# Patient Record
Sex: Male | Born: 1960 | Race: White | Hispanic: No | Marital: Married | Smoking: Never smoker
Health system: Southern US, Community
[De-identification: ages and names within clinical notes are randomized; demographics above are authoritative.]

## PROBLEM LIST (undated history)

## (undated) DIAGNOSIS — E119 Type 2 diabetes mellitus without complications: Secondary | ICD-10-CM

## (undated) DIAGNOSIS — K219 Gastro-esophageal reflux disease without esophagitis: Secondary | ICD-10-CM

## (undated) HISTORY — PX: FOOT SURGERY: SHX648

---

## 2017-07-10 DIAGNOSIS — I10 Essential (primary) hypertension: Secondary | ICD-10-CM | POA: Diagnosis not present

## 2017-08-28 DIAGNOSIS — J020 Streptococcal pharyngitis: Secondary | ICD-10-CM | POA: Diagnosis not present

## 2017-09-08 DIAGNOSIS — R300 Dysuria: Secondary | ICD-10-CM | POA: Diagnosis not present

## 2017-10-13 DIAGNOSIS — Z0000 Encounter for general adult medical examination without abnormal findings: Secondary | ICD-10-CM | POA: Diagnosis not present

## 2017-10-18 DIAGNOSIS — Z125 Encounter for screening for malignant neoplasm of prostate: Secondary | ICD-10-CM | POA: Diagnosis not present

## 2017-10-18 DIAGNOSIS — I10 Essential (primary) hypertension: Secondary | ICD-10-CM | POA: Diagnosis not present

## 2017-12-30 DIAGNOSIS — Z6828 Body mass index (BMI) 28.0-28.9, adult: Secondary | ICD-10-CM | POA: Diagnosis not present

## 2017-12-30 DIAGNOSIS — M25511 Pain in right shoulder: Secondary | ICD-10-CM | POA: Diagnosis not present

## 2018-03-09 DIAGNOSIS — M75101 Unspecified rotator cuff tear or rupture of right shoulder, not specified as traumatic: Secondary | ICD-10-CM | POA: Diagnosis not present

## 2018-03-16 DIAGNOSIS — M25511 Pain in right shoulder: Secondary | ICD-10-CM | POA: Diagnosis not present

## 2018-03-16 DIAGNOSIS — M7541 Impingement syndrome of right shoulder: Secondary | ICD-10-CM | POA: Diagnosis not present

## 2018-04-03 DIAGNOSIS — M542 Cervicalgia: Secondary | ICD-10-CM | POA: Diagnosis not present

## 2018-04-03 DIAGNOSIS — M545 Low back pain: Secondary | ICD-10-CM | POA: Diagnosis not present

## 2018-04-03 DIAGNOSIS — M546 Pain in thoracic spine: Secondary | ICD-10-CM | POA: Diagnosis not present

## 2018-04-06 DIAGNOSIS — M545 Low back pain: Secondary | ICD-10-CM | POA: Diagnosis not present

## 2018-04-06 DIAGNOSIS — M542 Cervicalgia: Secondary | ICD-10-CM | POA: Diagnosis not present

## 2018-04-06 DIAGNOSIS — M546 Pain in thoracic spine: Secondary | ICD-10-CM | POA: Diagnosis not present

## 2018-04-09 DIAGNOSIS — M542 Cervicalgia: Secondary | ICD-10-CM | POA: Diagnosis not present

## 2018-04-09 DIAGNOSIS — M545 Low back pain: Secondary | ICD-10-CM | POA: Diagnosis not present

## 2018-04-09 DIAGNOSIS — M546 Pain in thoracic spine: Secondary | ICD-10-CM | POA: Diagnosis not present

## 2018-05-04 DIAGNOSIS — M7541 Impingement syndrome of right shoulder: Secondary | ICD-10-CM | POA: Diagnosis not present

## 2018-05-20 DIAGNOSIS — M542 Cervicalgia: Secondary | ICD-10-CM | POA: Diagnosis not present

## 2018-05-20 DIAGNOSIS — M545 Low back pain: Secondary | ICD-10-CM | POA: Diagnosis not present

## 2018-05-20 DIAGNOSIS — M546 Pain in thoracic spine: Secondary | ICD-10-CM | POA: Diagnosis not present

## 2018-06-09 DIAGNOSIS — M7540 Impingement syndrome of unspecified shoulder: Secondary | ICD-10-CM | POA: Insufficient documentation

## 2018-06-09 DIAGNOSIS — M25511 Pain in right shoulder: Secondary | ICD-10-CM | POA: Diagnosis not present

## 2018-06-09 DIAGNOSIS — M7541 Impingement syndrome of right shoulder: Secondary | ICD-10-CM | POA: Diagnosis not present

## 2018-06-19 DIAGNOSIS — M7541 Impingement syndrome of right shoulder: Secondary | ICD-10-CM | POA: Diagnosis not present

## 2018-06-19 DIAGNOSIS — M25511 Pain in right shoulder: Secondary | ICD-10-CM | POA: Diagnosis not present

## 2018-07-08 DIAGNOSIS — M75101 Unspecified rotator cuff tear or rupture of right shoulder, not specified as traumatic: Secondary | ICD-10-CM | POA: Diagnosis not present

## 2018-07-08 DIAGNOSIS — M7541 Impingement syndrome of right shoulder: Secondary | ICD-10-CM | POA: Diagnosis not present

## 2018-10-21 DIAGNOSIS — Z4789 Encounter for other orthopedic aftercare: Secondary | ICD-10-CM | POA: Insufficient documentation

## 2020-07-25 ENCOUNTER — Ambulatory Visit: Payer: 59 | Admitting: Podiatry

## 2020-07-25 ENCOUNTER — Other Ambulatory Visit: Payer: Self-pay

## 2020-07-25 ENCOUNTER — Ambulatory Visit (INDEPENDENT_AMBULATORY_CARE_PROVIDER_SITE_OTHER): Payer: 59

## 2020-07-25 ENCOUNTER — Encounter: Payer: Self-pay | Admitting: Podiatry

## 2020-07-25 DIAGNOSIS — M778 Other enthesopathies, not elsewhere classified: Secondary | ICD-10-CM

## 2020-07-25 DIAGNOSIS — M2041 Other hammer toe(s) (acquired), right foot: Secondary | ICD-10-CM

## 2020-07-25 DIAGNOSIS — M2042 Other hammer toe(s) (acquired), left foot: Secondary | ICD-10-CM

## 2020-07-25 DIAGNOSIS — M2012 Hallux valgus (acquired), left foot: Secondary | ICD-10-CM

## 2020-07-25 DIAGNOSIS — M2011 Hallux valgus (acquired), right foot: Secondary | ICD-10-CM | POA: Diagnosis not present

## 2020-07-25 MED ORDER — METHYLPREDNISOLONE 4 MG PO TBPK: ORAL_TABLET | ORAL | 0 refills | Status: DC

## 2020-07-25 MED ORDER — MELOXICAM 15 MG PO TABS
15.0000 mg | ORAL_TABLET | Freq: Every day | ORAL | 3 refills | Status: DC
Start: 2020-07-25 — End: 2020-10-06

## 2020-07-25 NOTE — Progress Notes (Signed)
  Subjective:  Patient ID: Tyler Rangel, male    DOB: 27-Nov-1960,  MRN: [id number] HPI Chief Complaint  Patient presents with  . Foot Pain    1st toes and MPJ/2nd toes bilateral (R>L) - aching, red x several months, plantar forefoot right tender after driving or walking all day, episodes of severe pain, redness and swelling, uses bandaids to cushion areas  . New Patient (Initial Visit)    60 y.o. male presents with the above complaint.   ROS: Denies fever chills nausea vomiting muscle aches pains calf pain back pain chest pain shortness of breath.  No past medical history on file.   Current Outpatient Medications:  .  hydrochlorothiazide (MICROZIDE) 12.5 MG capsule, Take 12.5 mg by mouth daily., Disp: , Rfl:  .  loratadine (CLARITIN) 10 MG tablet, Take 10 mg by mouth daily., Disp: , Rfl:  .  meloxicam (MOBIC) 15 MG tablet, Take 1 tablet (15 mg total) by mouth daily., Disp: 30 tablet, Rfl: 3 .  methylPREDNISolone (MEDROL DOSEPAK) 4 MG TBPK tablet, 6 day dose pack - take as directed, Disp: 21 tablet, Rfl: 0 .  esomeprazole (NEXIUM) 40 MG capsule, Take 40 mg by mouth daily., Disp: , Rfl:   Allergies  Allergen Reactions  . Hydrocodone    Review of Systems Objective:  There were no vitals filed for this visit.  General: Well developed, nourished, in no acute distress, alert and oriented x3   Dermatological: Skin is warm, dry and supple bilateral. Nails x 10 are well maintained; remaining integument appears unremarkable at this time. There are no open sores, no preulcerative lesions, no rash or signs of infection present.  Vascular: Dorsalis Pedis artery and Posterior Tibial artery pedal pulses are 2/4 bilateral with immedate capillary fill time. Pedal hair growth present. No varicosities and no lower extremity edema present bilateral.   Neruologic: Grossly intact via light touch bilateral. Vibratory intact via tuning fork bilateral. Protective threshold with Semmes Wienstein  monofilament intact to all pedal sites bilateral. Patellar and Achilles deep tendon reflexes 2+ bilateral. No Babinski or clonus noted bilateral.   Musculoskeletal: No gross boney pedal deformities bilateral. No pain, crepitus, or limitation noted with foot and ankle range of motion bilateral. Muscular strength 5/5 in all groups tested bilateral.  Pain on range of motion of the first metatarsophalangeal joint with hallux abductovalgus deformities bilateral no crepitation is identified.  He does have hammertoe deformities which are semirigid at the level of the PIPJ and DIPJ.  Gait: Unassisted, Nonantalgic.    Radiographs:  Radiographs taken today demonstrate an osseous mature individual with hallux abductovalgus deformities bilateral mild flexible hammertoe deformities bilateral with some osteoarthritic changes at the PIPJ and DIPJ.  Also demonstrates intracapsular ossification and what appears to be some increase in density of the medial capsule consistent with tophi.  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    Plan: Discussed etiology pathology conservative surgical therapies at this point we are going to ask for a CBC CMP arthritic profile.  I am also going to start him on methylprednisolone to be followed by meloxicam.  I would like to follow-up with him once the results come back or in a month to discuss surgical intervention.     Leevi Cullars T. Victoria Vera, [place]

## 2020-07-27 LAB — COMPLETE METABOLIC PANEL WITH GFR
AG Ratio: 1.5 (calc) (ref 1.0–2.5)
ALT: 37 U/L (ref 9–46)
AST: 20 U/L (ref 10–35)
Albumin: 4.3 g/dL (ref 3.6–5.1)
Alkaline phosphatase (APISO): 113 U/L (ref 35–144)
BUN: 16 mg/dL (ref 7–25)
CO2: 29 mmol/L (ref 20–32)
Calcium: 9.4 mg/dL (ref 8.6–10.3)
Chloride: 106 mmol/L (ref 98–110)
Creat: 1.1 mg/dL (ref 0.70–1.33)
GFR, Est African American: 85 mL/min/{1.73_m2} (ref >=60)
GFR, Est Non African American: 73 mL/min/{1.73_m2} (ref >=60)
Globulin: 2.9 g/dL (calc) (ref 1.9–3.7)
Glucose, Bld: 102 mg/dL (ref 65–139)
Potassium: 4 mmol/L (ref 3.5–5.3)
Sodium: 144 mmol/L (ref 135–146)
Total Bilirubin: 0.7 mg/dL (ref 0.2–1.2)
Total Protein: 7.2 g/dL (ref 6.1–8.1)

## 2020-07-27 LAB — URIC ACID: Uric Acid, Serum: 5.7 mg/dL (ref 4.0–8.0)

## 2020-07-27 LAB — CBC WITH DIFFERENTIAL/PLATELET
Absolute Monocytes: 525 cells/uL (ref 200–950)
Basophils Absolute: 60 cells/uL (ref 0–200)
Basophils Relative: 0.8 %
Eosinophils Absolute: 233 cells/uL (ref 15–500)
Eosinophils Relative: 3.1 %
HCT: 45.9 % (ref 38.5–50.0)
Hemoglobin: 16 g/dL (ref 13.2–17.1)
Lymphs Abs: 2468 cells/uL (ref 850–3900)
MCH: 31.3 pg (ref 27.0–33.0)
MCHC: 34.9 g/dL (ref 32.0–36.0)
MCV: 89.6 fL (ref 80.0–100.0)
MPV: 9.7 fL (ref 7.5–12.5)
Monocytes Relative: 7 %
Neutro Abs: 4215 cells/uL (ref 1500–7800)
Neutrophils Relative %: 56.2 %
Platelets: 250 10*3/uL (ref 140–400)
RBC: 5.12 10*6/uL (ref 4.20–5.80)
RDW: 12.8 % (ref 11.0–15.0)
Total Lymphocyte: 32.9 %
WBC: 7.5 10*3/uL (ref 3.8–10.8)

## 2020-07-27 LAB — C-REACTIVE PROTEIN: CRP: 5.5 mg/L (ref <8.0)

## 2020-07-27 LAB — SEDIMENTATION RATE: Sed Rate: 6 mm/h (ref 0–20)

## 2020-07-27 LAB — ANA: Anti Nuclear Antibody (ANA): NEGATIVE

## 2020-07-27 LAB — RHEUMATOID FACTOR: Rheumatoid fact SerPl-aCnc: <14 IU/mL (ref <14)

## 2020-08-02 DIAGNOSIS — M79676 Pain in unspecified toe(s): Secondary | ICD-10-CM

## 2020-08-04 ENCOUNTER — Telehealth: Payer: Self-pay | Admitting: Podiatry

## 2020-08-04 NOTE — Telephone Encounter (Signed)
Tyler Rangel would like to have intermittent FMLA, due to his gout and foot pain. He said there are days that he is just unable to stand. Please advise?

## 2020-08-08 NOTE — Telephone Encounter (Signed)
Ok. Six months

## 2020-08-10 ENCOUNTER — Telehealth: Payer: Self-pay

## 2020-08-10 NOTE — Telephone Encounter (Signed)
-----   Message from Elinor Parkinson, [place] sent at 07/27/2020  6:54 AM EDT ----- Blood work looks great.

## 2020-08-10 NOTE — Telephone Encounter (Signed)
Patient has been notified of results.  

## 2020-08-29 ENCOUNTER — Other Ambulatory Visit: Payer: Self-pay

## 2020-08-29 ENCOUNTER — Encounter: Payer: Self-pay | Admitting: Podiatry

## 2020-08-29 ENCOUNTER — Ambulatory Visit: Payer: 59 | Admitting: Podiatry

## 2020-08-29 DIAGNOSIS — M2041 Other hammer toe(s) (acquired), right foot: Secondary | ICD-10-CM | POA: Diagnosis not present

## 2020-08-29 DIAGNOSIS — M2042 Other hammer toe(s) (acquired), left foot: Secondary | ICD-10-CM

## 2020-08-29 DIAGNOSIS — M2011 Hallux valgus (acquired), right foot: Secondary | ICD-10-CM | POA: Diagnosis not present

## 2020-08-29 DIAGNOSIS — G8929 Other chronic pain: Secondary | ICD-10-CM

## 2020-08-29 DIAGNOSIS — M778 Other enthesopathies, not elsewhere classified: Secondary | ICD-10-CM

## 2020-08-29 DIAGNOSIS — M79671 Pain in right foot: Secondary | ICD-10-CM

## 2020-08-29 DIAGNOSIS — M2012 Hallux valgus (acquired), left foot: Secondary | ICD-10-CM

## 2020-08-30 NOTE — Progress Notes (Signed)
He presents today states that he is still having chronic pain of the right foot.  Nothing has alleviated his symptoms and is starting to affect his ability to perform his daily activities.  Objective: Vital signs are stable he is alert and oriented x3 pulses are palpable.  He has severe pain on palpation and range of motion of the first metatarsophalangeal joint.  All rheumatic tests were negative.  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  Plan: MRI is requested for evaluation of the first and second metatarsal phalangeal joints.  Rheumatologic evaluation was negative requesting special imaging for differential diagnosis and surgical planning.

## 2020-09-15 ENCOUNTER — Other Ambulatory Visit: Payer: Self-pay

## 2020-09-15 ENCOUNTER — Ambulatory Visit
Admission: RE | Admit: 2020-09-15 | Discharge: 2020-09-15 | Disposition: A | Discharge status: Home / Self Care | Payer: 59 | Source: Ambulatory Visit | Attending: Podiatry | Admitting: Podiatry

## 2020-09-15 DIAGNOSIS — M778 Other enthesopathies, not elsewhere classified: Secondary | ICD-10-CM

## 2020-09-15 DIAGNOSIS — G8929 Other chronic pain: Secondary | ICD-10-CM

## 2020-09-28 ENCOUNTER — Encounter: Payer: Self-pay | Admitting: Podiatry

## 2020-09-28 ENCOUNTER — Other Ambulatory Visit: Payer: Self-pay

## 2020-09-28 ENCOUNTER — Ambulatory Visit: Payer: 59 | Admitting: Podiatry

## 2020-09-28 DIAGNOSIS — M778 Other enthesopathies, not elsewhere classified: Secondary | ICD-10-CM

## 2020-09-28 DIAGNOSIS — M2012 Hallux valgus (acquired), left foot: Secondary | ICD-10-CM

## 2020-09-28 DIAGNOSIS — M2011 Hallux valgus (acquired), right foot: Secondary | ICD-10-CM

## 2020-09-28 MED ORDER — TRIAMCINOLONE ACETONIDE 40 MG/ML IJ SUSP
20.0000 mg | Freq: Once | INTRAMUSCULAR | Status: AC
  Administered 2020-09-28: 20 mg

## 2020-09-28 NOTE — Progress Notes (Signed)
He presents today for follow-up of his MRI states that his right foot is still painful around the first metatarsophalangeal joint.  Objective: Vital signs are stable alert oriented x3.  There is no erythema edema cellulitis drainage or odor he does have pain on palpation and end range of motion of the hallux valgus with limitation resulting from hallux limitus.  MRI does demonstrate osteoarthritis joint space narrowing subchondral sclerosis and displacement at the metatarsophalangeal joint of the right hallux.  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  Plan: We discussed the need for surgical intervention today consisting of a Keller arthroplasty with single silicone implant as well as a hammertoe repair and second met osteotomy.  States he cannot be at work that long so we will have to just give shots as needed.  I injected him today with Kenalog and local anesthetic into the first metatarsophalangeal joint and around the joint he tolerated procedure well and will follow-up with me in the near future.

## 2020-10-06 ENCOUNTER — Other Ambulatory Visit: Payer: Self-pay | Admitting: Podiatry

## 2021-01-23 ENCOUNTER — Other Ambulatory Visit: Payer: Self-pay | Admitting: Podiatry

## 2021-01-25 DIAGNOSIS — M79676 Pain in unspecified toe(s): Secondary | ICD-10-CM

## 2021-05-11 ENCOUNTER — Other Ambulatory Visit: Payer: Self-pay | Admitting: Podiatry

## 2021-06-07 ENCOUNTER — Other Ambulatory Visit: Payer: Self-pay | Admitting: Podiatry

## 2021-07-31 DIAGNOSIS — M79676 Pain in unspecified toe(s): Secondary | ICD-10-CM

## 2021-08-30 ENCOUNTER — Ambulatory Visit (INDEPENDENT_AMBULATORY_CARE_PROVIDER_SITE_OTHER): Payer: 59

## 2021-08-30 ENCOUNTER — Encounter: Payer: Self-pay | Admitting: Podiatry

## 2021-08-30 ENCOUNTER — Ambulatory Visit: Payer: 59 | Admitting: Podiatry

## 2021-08-30 DIAGNOSIS — M778 Other enthesopathies, not elsewhere classified: Secondary | ICD-10-CM

## 2021-08-30 DIAGNOSIS — M2012 Hallux valgus (acquired), left foot: Secondary | ICD-10-CM | POA: Diagnosis not present

## 2021-08-30 DIAGNOSIS — M2011 Hallux valgus (acquired), right foot: Secondary | ICD-10-CM

## 2021-09-02 NOTE — Progress Notes (Signed)
He presents today with his son for continued pain of the first metatarsal phalangeal joint of the right foot.  MRI of the head demonstrated at one time that there was significant osteoarthritic changes of the first metatarsophalangeal joint.  Objective: Vital signs are stable he is alert and oriented x3 there is been no change in the first metatarsophalangeal joint other than worsening of the joint itself.  He states it becomes more more painful it seems all the time is better on the weekends when he is not working and he is able to stay off the foot to some degree.  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  Plan: Discussed etiology pathology conservative surgical therapies discussed left fusion of the first metatarsal phalangeal joint as well as a possible implant of the first metatarsophalangeal joint of the sesamoids are considerably arthritic I did explain to him that this could still be problematic for him.  He asked if there were any other options we did discuss the need for orthotics.  I did explain that orthotics very well could help offload the forefoot enough to allow for at least maybe a 50% reduction in his symptomatology.  He would like to try this prior to any surgical intervention.  Arlys John about to consider casting this patient for neutral orthotics that would offload the first metatarsal phalangeal joint of his right foot.

## 2021-09-06 ENCOUNTER — Ambulatory Visit: Payer: 59

## 2021-09-06 DIAGNOSIS — M2011 Hallux valgus (acquired), right foot: Secondary | ICD-10-CM

## 2021-09-06 DIAGNOSIS — M2041 Other hammer toe(s) (acquired), right foot: Secondary | ICD-10-CM

## 2021-09-06 DIAGNOSIS — M778 Other enthesopathies, not elsewhere classified: Secondary | ICD-10-CM

## 2021-09-06 NOTE — Progress Notes (Signed)
SITUATION Reason for Consult: Evaluation for Bilateral Custom Foot Orthoses Patient / Caregiver Report: Patient is ready for foot orthotics  OBJECTIVE DATA: Patient History / Diagnosis:    ICD-10-CM   1. Capsulitis of foot, right  M77.8     2. Valgus deformity of both great toes  M20.11    M20.12     3. Hammer toes of both feet  M20.41    M20.42       Current or Previous Devices:   None and no history  Foot Examination: Skin presentation:   Intact Ulcers & Callousing:   None Toe / Foot Deformities:  Hammertoes Weight Bearing Presentation:  Cavus Sensation:    Intact  Shoe Size:    9.69M  ORTHOTIC RECOMMENDATION Recommended Device: 1x pair of custom functional foot orthotics  GOALS OF ORTHOSES - Reduce Pain - Prevent Foot Deformity - Prevent Progression of Further Foot Deformity - Relieve Pressure - Improve the Overall Biomechanical Function of the Foot and Lower Extremity.  ACTIONS PERFORMED Potential out of pocket cost was communicated to patient. Patient understood and consent to casting. Patient was casted for Foot Orthoses via crush box. Procedure was explained and patient tolerated procedure well. Casts were shipped to central fabrication. All questions were answered and concerns addressed.  PLAN Patient is to be called for fitting when devices are ready.

## 2021-09-11 ENCOUNTER — Ambulatory Visit: Payer: 59 | Admitting: Podiatry

## 2021-10-18 ENCOUNTER — Encounter: Payer: Self-pay | Admitting: Podiatry

## 2021-10-18 ENCOUNTER — Ambulatory Visit (INDEPENDENT_AMBULATORY_CARE_PROVIDER_SITE_OTHER): Payer: 59

## 2021-10-18 DIAGNOSIS — M2041 Other hammer toe(s) (acquired), right foot: Secondary | ICD-10-CM

## 2021-10-18 DIAGNOSIS — M2012 Hallux valgus (acquired), left foot: Secondary | ICD-10-CM

## 2021-10-18 DIAGNOSIS — M2042 Other hammer toe(s) (acquired), left foot: Secondary | ICD-10-CM

## 2021-10-18 DIAGNOSIS — M778 Other enthesopathies, not elsewhere classified: Secondary | ICD-10-CM

## 2021-10-18 DIAGNOSIS — M2011 Hallux valgus (acquired), right foot: Secondary | ICD-10-CM

## 2021-10-19 NOTE — Progress Notes (Signed)
Reason for Visit:        Fitting and Delivery of Custom Fabricated Foot Orthotics Patient Report:            Patient reports comfort and is satisfied with device.   ACTIONS PERFORMED Patient was fit with foot orthotics trimmed to shoe last. Patient tolerated fitting procedure.    Patient was provided with verbal and written instruction and demonstration regarding  wear, care, proper fit, function, purpose, cleaning, and use of the orthosis and in all related precautions and risks and benefits regarding the orthosis.   Patient was also provided with verbal instruction regarding how to report any failures or malfunctions of the orthosis and necessary follow up care. Patient was also instructed to contact our office regarding any change in status that may affect the function of the orthosis.   Patient demonstrated independence with proper donning, doffing, and fit and verbalized understanding of all instructions.   PLAN: Patient is to follow up in one week or as necessary (PRN). All questions were answered and concerns addressed. 

## 2021-11-01 ENCOUNTER — Encounter: Payer: Self-pay | Admitting: Podiatry

## 2021-11-01 ENCOUNTER — Ambulatory Visit: Payer: 59 | Admitting: Podiatry

## 2021-11-01 DIAGNOSIS — G5791 Unspecified mononeuropathy of right lower limb: Secondary | ICD-10-CM

## 2021-11-01 DIAGNOSIS — M7751 Other enthesopathy of right foot: Secondary | ICD-10-CM | POA: Diagnosis not present

## 2021-11-01 DIAGNOSIS — M778 Other enthesopathies, not elsewhere classified: Secondary | ICD-10-CM

## 2021-11-01 MED ORDER — TRIAMCINOLONE ACETONIDE 40 MG/ML IJ SUSP
60.0000 mg | Freq: Once | INTRAMUSCULAR | Status: AC
  Administered 2021-11-01: 60 mg

## 2021-11-01 NOTE — Progress Notes (Signed)
He presents today for follow-up of his capsulitis right foot.  States that he has been wearing his orthotics and doing everything that he can.  He says I think he just overdid about wearing them for a long time at church the other day states that he is got some sharp shooting pains between his toes and radiating out his toes and across his forefoot.  He states that it is hurting pretty bad at this time think I can stand at work and do what I have to do.  Objective: Vital signs are stable he is alert and oriented x3.  Hallux valgus deformity with hallux interphalangeal and osteoarthritic changes at the hallux interphalangeal joint.  Hammertoe deformity with capsulitis at the second metatarsal phalangeal joint and flexible hammertoe deformities #3 #4 #5 of the right foot.  He does have pain on palpation and range of motion of the second metatarsophalangeal joint though he does have soreness in the second interdigital space third interdigital space and the first interdigital space.  Assessment: Hallux valgus deformity capsulitis cocked up hammertoe deformity and neuritis.  Plan: Discussed etiology pathology conservative therapies injected the first interdigital space with 10 mg of Kenalog secondary to10 mg Kenalog third interdigital space with 10 mg of Kenalog.  He tolerated procedure well recommended he continue the use of his orthotics and recommend he be out of work Today and tomorrow states that he is going on vacation next week stating he will be at home resting this foot trying to get it ready to go back to work.

## 2022-05-07 ENCOUNTER — Ambulatory Visit (INDEPENDENT_AMBULATORY_CARE_PROVIDER_SITE_OTHER): Payer: 59

## 2022-05-07 ENCOUNTER — Ambulatory Visit: Payer: 59 | Admitting: Podiatry

## 2022-05-07 ENCOUNTER — Encounter: Payer: Self-pay | Admitting: Podiatry

## 2022-05-07 DIAGNOSIS — M778 Other enthesopathies, not elsewhere classified: Secondary | ICD-10-CM

## 2022-05-07 DIAGNOSIS — S92352A Displaced fracture of fifth metatarsal bone, left foot, initial encounter for closed fracture: Secondary | ICD-10-CM

## 2022-05-07 NOTE — Progress Notes (Signed)
He presents today for follow-up of his pain to his left foot.  States that he did started about a week or so ago and hurts right here as he points to the fifth metatarsal.  Objective: Vital signs stable he is alert and oriented x 3.  Severe hallux valgus deformities hammertoe deformities bilateral.  He does have swelling and mild pitting edema overlying the fifth metatarsal base of the left foot.  Exquisitely tender on palpation dorsal and plantar.  Radiographs taken today demonstrate a single fracture line that does not comminute the fifth met base.  Assessment: Stress fracture fifth met base left foot.  Plan: Placed him in a cam boot and will follow-up with him in 6 to 8 weeks.

## 2022-06-04 ENCOUNTER — Ambulatory Visit: Payer: 59 | Admitting: Podiatry

## 2022-06-04 ENCOUNTER — Ambulatory Visit (INDEPENDENT_AMBULATORY_CARE_PROVIDER_SITE_OTHER): Payer: 59

## 2022-06-04 ENCOUNTER — Encounter: Payer: Self-pay | Admitting: Podiatry

## 2022-06-04 DIAGNOSIS — S92355D Nondisplaced fracture of fifth metatarsal bone, left foot, subsequent encounter for fracture with routine healing: Secondary | ICD-10-CM

## 2022-06-04 NOTE — Progress Notes (Signed)
Sher presents today for follow-up of his fifth metatarsal base fracture left.  States that it seems to be doing okay.  Objective: Vital signs are stable alert oriented x 3.  There is no erythema edema cellulitis drainage or odor.  There is still tenderness on palpation of fifth met base left.  Radiographs taken today demonstrate radiolucency around the plantar portion of the fracture on lateral view.  Assessment: Fifth metatarsal base fracture left.  Nondisplaced none comminuted.  Plan: Put him back in a cam walker which she can only wear at home versus work but he is wearing it on the weekends.  Follow-up with him in about 1 month for this.  Another set of x-rays will be necessary.

## 2022-06-20 ENCOUNTER — Ambulatory Visit (INDEPENDENT_AMBULATORY_CARE_PROVIDER_SITE_OTHER): Payer: 59

## 2022-06-20 ENCOUNTER — Encounter: Payer: Self-pay | Admitting: Podiatry

## 2022-06-20 ENCOUNTER — Ambulatory Visit: Payer: 59 | Admitting: Podiatry

## 2022-06-20 DIAGNOSIS — S92355D Nondisplaced fracture of fifth metatarsal bone, left foot, subsequent encounter for fracture with routine healing: Secondary | ICD-10-CM

## 2022-06-20 NOTE — Progress Notes (Signed)
  He presents today for follow-up of his fracture fifth metatarsal left.  States he is doing quite well is feeling much better.  Objective: Vital signs are stable she is alert oriented x 3.  He has minimal tenderness on palpation of the fifth metatarsal base left foot.  Radiographs taken today demonstrate some consolidation starting to take place with a Jones fracture.  No diastases.  No fragmentation.  Assessment: Well-healing Jones fracture left.  Plan: Encouraged him to continue to wear the cam boot is much as he can he is going to be out of work for the next week or so for vacation and states he is planning to wear the boot at all times.  He will need a note for being out of work today and tomorrow and we will do that for him.  Follow-up with him in 6 weeks

## 2022-06-26 ENCOUNTER — Telehealth: Payer: Self-pay | Admitting: Podiatry

## 2022-06-26 NOTE — Telephone Encounter (Signed)
Good Morning Mr. Tyler Rangel would like to have his intermittent leave for work updated, he had a history of capsulitis but I didn't see it on his diagnosis, this time. Please advise about intermittent leave for his job?

## 2022-06-27 ENCOUNTER — Ambulatory Visit: Payer: 59 | Admitting: Podiatry

## 2022-07-04 ENCOUNTER — Ambulatory Visit: Payer: 59 | Admitting: Podiatry

## 2022-07-04 ENCOUNTER — Encounter: Payer: Self-pay | Admitting: Podiatry

## 2022-07-04 DIAGNOSIS — M7751 Other enthesopathy of right foot: Secondary | ICD-10-CM | POA: Diagnosis not present

## 2022-07-04 MED ORDER — TRIAMCINOLONE ACETONIDE 10 MG/ML IJ SUSP
20.0000 mg | Freq: Once | INTRAMUSCULAR | Status: AC
  Administered 2022-07-04: 20 mg

## 2022-07-04 NOTE — Progress Notes (Signed)
Subjective:   Patient ID: Tyler Rangel, male   DOB: 62 y.o.   MRN: [id number]   HPI Patient states he was doing good with his right foot till recently but has put a lot more pressure on the foot because of his fracture of the left foot with having to wear a walking boot   ROS      Objective:  Physical Exam  Neurovascular status intact with inflammation fluid around the second third and fourth metatarsal phalangeal joints right painful when pressed that did well when treated in July     Assessment:  Inflammatory capsulitis of the lesser MPJs right with history of condition that did well and offloading onto the right foot because of fracture left foot     Plan:  H&P done sterile prep and went ahead today and did periarticular injection around the second and third third and fourth MPJ and then sterile prep and did periarticular injection around the fourth MPJ right with dexamethasone Kenalog Xylocaine combination and applied sterile dressing.  Reappoint see Dr. Milinda Pointer regular appointment in April

## 2022-07-18 ENCOUNTER — Encounter: Payer: Self-pay | Admitting: Podiatry

## 2022-07-18 ENCOUNTER — Ambulatory Visit (INDEPENDENT_AMBULATORY_CARE_PROVIDER_SITE_OTHER): Payer: 59

## 2022-07-18 ENCOUNTER — Other Ambulatory Visit: Payer: Self-pay | Admitting: Podiatry

## 2022-07-18 ENCOUNTER — Ambulatory Visit: Payer: 59 | Admitting: Podiatry

## 2022-07-18 DIAGNOSIS — S92355D Nondisplaced fracture of fifth metatarsal bone, left foot, subsequent encounter for fracture with routine healing: Secondary | ICD-10-CM

## 2022-07-18 DIAGNOSIS — M7751 Other enthesopathy of right foot: Secondary | ICD-10-CM

## 2022-07-18 NOTE — Progress Notes (Signed)
He presents today for follow-up of a fracture fifth metatarsal base of the left foot states that is feeling great I have had no problems with it since have been wearing the boot.  He states that this right foot however is starting to give me a lot of trouble and it is affecting my ability to not only work to perform my daily activities and maintain my general health.  Objective: Vital signs are stable he is alert and oriented x 3.  Pulses are palpable.  Moderate to severe hallux abductovalgus deformity of the right foot with radiographic measurements measuring an IM angle of 12 degrees hallux abductus angle of 25 degrees severe arthritic hammertoe deformity and dislocated second metatarsal phalangeal joint.  Findings are consistent with hallux valgus deformity capsulitis and hammertoe deformity with plantarflexed second metatarsal these findings are consistent on physical exam as well.  No pain on palpation of the left foot.  Assessment: Hallux abductovalgus deformity of the right foot plantarflexed second metatarsal right foot hammertoe second digit right foot.  Well-healing fracture left foot.  Plan: Discussed etiology pathology and surgical therapies at this point I recommended that he get started back on his shoewear on his left foot.  We did discuss the need for an [facility] bunion repair on the right foot with second metatarsal osteotomy and hammertoe repair with pin.  He understands this and is amenable to it.  We did discuss the possible postop complications which may include but not limited to postop pain bleeding swell infection recurrence need for further surgery overcorrection under correction loss of digit loss limb loss of life  Dispensed cam boot and information regarding the surgery center and he was sent to scheduling.  I will follow-up with him in the near future for surgical intervention.

## 2022-07-25 ENCOUNTER — Telehealth: Payer: Self-pay | Admitting: Urology

## 2022-07-25 NOTE — Telephone Encounter (Signed)
DOS - 08/16/22  [place] BUNIONECTOMY RIGHT --- 16109 METATARSAL OSTEOTOMY 2ND RIGHT --- 60454 HAMMERTOE REPAIR 2ND RIGHT --- 09811  [facility] EFFECTIVE DATE - 04/08/22  DEDUCTIBLE - $0.00 OOP - $500.00 W/ $398.00 REMAINING COINSURANCE - 0%   PER [employer] WEBSITE FOR CPT CODES 91478, 28308 AND 28285 HAS BEEN APPROVED, AUTH # [id number], GOOD FROM 08/16/22 - 11/14/22.

## 2022-07-30 ENCOUNTER — Encounter: Payer: Self-pay | Admitting: Podiatry

## 2022-08-02 ENCOUNTER — Ambulatory Visit (INDEPENDENT_AMBULATORY_CARE_PROVIDER_SITE_OTHER): Payer: 59

## 2022-08-02 ENCOUNTER — Encounter: Payer: Self-pay | Admitting: Podiatry

## 2022-08-02 ENCOUNTER — Ambulatory Visit: Payer: 59 | Admitting: Podiatry

## 2022-08-02 ENCOUNTER — Telehealth: Payer: Self-pay | Admitting: Podiatry

## 2022-08-02 DIAGNOSIS — R52 Pain, unspecified: Secondary | ICD-10-CM

## 2022-08-02 DIAGNOSIS — S99192S Other physeal fracture of left metatarsal, sequela: Secondary | ICD-10-CM

## 2022-08-02 DIAGNOSIS — M79672 Pain in left foot: Secondary | ICD-10-CM

## 2022-08-02 NOTE — Progress Notes (Signed)
Subjective:  Patient ID: Tyler Rangel, male    DOB: 04-14-1960,  MRN: [id number]  Chief Complaint  Patient presents with   Foot Injury    left foot fracture - very painful, pain located on the lateral side of foot near the 5th digit, pain is dull and aching     62 y.o. male presents with concern for pain in the left foot.  He has a pain on the outside of his left foot at site of prior fracture.  He was previously treated with a cam boot immobilization for a left fifth metatarsal base fracture for multiple months.  He says he is at least 4 months into the treatment and was doing very well had pretty much progressed out of the boot back to regular shoes and was not having any pain at the area of the fracture.  He recently had another injury and now has severe pain at the same area as before.  He has been walking in a cam boot.  He was planned to have surgery with Dr. Al Corpus coming up in 2 weeks for right foot bunion correction.  No past medical history on file.  Allergies  Allergen Reactions   Hydrocodone     ROS: Negative except as per HPI above  Objective:  General: AAO x3, NAD  Dermatological: With inspection and palpation of the right and left lower extremities there are no open sores, no preulcerative lesions, no rash or signs of infection present. Nails are of normal length thickness and coloration.   Vascular:  Dorsalis Pedis artery and Posterior Tibial artery pedal pulses are 2/4 bilateral.  Capillary fill time < 3 sec to all digits.   Neruologic: Grossly intact via light touch bilateral. Protective threshold intact to all sites bilateral.   Musculoskeletal: Pain with palpation of the lateral aspect of the fifth metatarsal base.  Edema and mild inflammation noted at the area.  Gait: Unassisted, Nonantalgic.   No images are attached to the encounter.  Radiographs:  Date: 08/02/2022 XR the left foot Weightbearing AP/Lateral/Oblique   Findings: Attention directed to the fifth  metatarsal base there is noted to be refracture of prior Jones fracture at the metadiaphysis diaphysis junction of the left fifth metatarsal.  There is mild gapping laterally and plantarly seen on oblique and lateral views.  Overall consistent with a Jones fracture of the left fifth met Assessment:   1. Pain   2. Closed fracture of base of fifth metatarsal bone of left foot at metaphyseal-diaphyseal junction, sequela      Plan:  Patient was evaluated and treated and all questions answered.  # Closed fracture of fifth metatarsal base consistent with Jones fracture, refracture prior injury that had healed -Discussed with patient that he does have a refracture of his old fracture that had mostly healed on prior views from last appointment. -I discussed unfortunately he will likely have to hold off on having any surgery done on the right foot with a new fracture on the left fifth metatarsal base -I would now recommend operative fixation of the left fifth metatarsal fracture to include intramedullary screw fixation of his Jones fracture given he has refractured at the same area as before -Discussed the risk benefits alternatives and possible complications associated with fifth metatarsal fracture open reduction internal fixation.  Discussed the expected postoperative recovery course including need for 4 to 6 weeks nonweightbearing following surgery. -Will begin operative planning. informed consent was obtained at this visit -In the meantime continue use of cam  boot immobilization and remain nonweightbearing with the aid of crutches or knee scooter for the left lower extremity.          Corinna Gab, DPM [facility]

## 2022-08-02 NOTE — Telephone Encounter (Signed)
Pt left message today @ 621am stating he needed to get in asap with Dr Al Corpus for his foot that was cracked.  I returned call and left message for pt to call to schedule an appt only available today would be Dr Annamary Rummage in [place]. I did tell pt Dr Al Corpus is out of the office today.  Pt called back and is seeing Dr Annamary Rummage today at 1245 in [place].

## 2022-08-05 ENCOUNTER — Telehealth: Payer: Self-pay | Admitting: Podiatry

## 2022-08-05 NOTE — Telephone Encounter (Signed)
Mckade called this morning stating he can't put weight on his foot. He stated it's been swollen over the weekend and he can't wear a steel toed shoe that is required for work. Please advise.

## 2022-08-06 ENCOUNTER — Encounter: Payer: Self-pay | Admitting: Podiatry

## 2022-08-07 ENCOUNTER — Telehealth: Payer: Self-pay | Admitting: Urology

## 2022-08-07 NOTE — Telephone Encounter (Signed)
DOS - 08/14/22  ORIF 5TH LEFT MET --- 16109  [employer] EFFECTIVE DATE - 04/08/22   DEDUCTIBLE - $0.00 OOP - $500.00 W/ $381.00 REMAINING COINSURANCE - 0%   PER [employer] WEBSITE FOR CPT CODE 60454 Notification or Prior Authorization is not required for the requested services   Decision ID #: [id number]

## 2022-08-13 ENCOUNTER — Other Ambulatory Visit (INDEPENDENT_AMBULATORY_CARE_PROVIDER_SITE_OTHER): Payer: 59 | Admitting: Podiatry

## 2022-08-13 DIAGNOSIS — Z9889 Other specified postprocedural states: Secondary | ICD-10-CM

## 2022-08-13 DIAGNOSIS — S99192S Other physeal fracture of left metatarsal, sequela: Secondary | ICD-10-CM

## 2022-08-13 MED ORDER — OXYCODONE-ACETAMINOPHEN 5-325 MG PO TABS: 1.0000 | ORAL_TABLET | ORAL | 0 refills | Status: AC | PRN

## 2022-08-13 MED ORDER — IBUPROFEN 800 MG PO TABS: 800.0000 mg | ORAL_TABLET | Freq: Three times a day (TID) | ORAL | 0 refills | Status: DC | PRN

## 2022-08-13 MED ORDER — CEPHALEXIN 500 MG PO CAPS: 500.0000 mg | ORAL_CAPSULE | Freq: Three times a day (TID) | ORAL | 0 refills | Status: AC

## 2022-08-13 NOTE — Progress Notes (Signed)
Postop medications sent 

## 2022-08-14 ENCOUNTER — Encounter: Payer: Self-pay | Admitting: Podiatry

## 2022-08-14 DIAGNOSIS — M79672 Pain in left foot: Secondary | ICD-10-CM

## 2022-08-14 DIAGNOSIS — S99192S Other physeal fracture of left metatarsal, sequela: Secondary | ICD-10-CM

## 2022-08-20 ENCOUNTER — Ambulatory Visit (INDEPENDENT_AMBULATORY_CARE_PROVIDER_SITE_OTHER): Payer: 59 | Admitting: Podiatry

## 2022-08-20 ENCOUNTER — Ambulatory Visit: Payer: 59

## 2022-08-20 ENCOUNTER — Ambulatory Visit (INDEPENDENT_AMBULATORY_CARE_PROVIDER_SITE_OTHER): Payer: 59

## 2022-08-20 DIAGNOSIS — M79672 Pain in left foot: Secondary | ICD-10-CM | POA: Diagnosis not present

## 2022-08-20 DIAGNOSIS — S99192S Other physeal fracture of left metatarsal, sequela: Secondary | ICD-10-CM

## 2022-08-20 DIAGNOSIS — Z9889 Other specified postprocedural states: Secondary | ICD-10-CM

## 2022-08-20 NOTE — Progress Notes (Signed)
  Subjective:  Patient ID: Tyler Rangel, male    DOB: 10/08/1960,  MRN: [id number]  Chief Complaint  Patient presents with   Post-op Follow-up    POV # 1 DOS 08/14/22 --- LEFT FOOT 5TH MET BASE FRACTURE ORI, patient has finished all antibiotics and is taking half of pain meds,     DOS: 08/14/2022 Procedure: Open reduction internal fixation of left fifth metatarsal base fracture  62 y.o. male returns for post-op check.  Patient presents for postop visit 1 week status post left foot open reduction and fixation with plate fixation of left fifth metatarsal base fracture.  He has been nonweightbearing to the left foot in a cam boot.  Has kept dressing clean dry intact as instructed.  Using Percocet and ibuprofen for pain.  Review of Systems: Negative except as noted in the HPI. Denies N/V/F/Ch.   Objective:  There were no vitals filed for this visit. There is no height or weight on file to calculate BMI. Constitutional Well developed. Well nourished.  Vascular Foot warm and well perfused. Capillary refill normal to all digits.  Calf is soft and supple, no posterior calf or knee pain, negative Homans' sign  Neurologic Normal speech. Oriented to person, place, and time. Epicritic sensation to light touch grossly present bilaterally.  Dermatologic Skin healing well without signs of infection. Skin edges well coapted without signs of infection.  Orthopedic: Tenderness to palpation noted about the surgical site.   Multiple view plain film radiographs: 08/20/2022 XR 3 views AP lateral oblique of the left foot Nonweightbearing.  Findings: Assessment:   1. Post-operative state   2. Closed fracture of base of fifth metatarsal bone of left foot at metaphyseal-diaphyseal junction, sequela    Plan:  Patient was evaluated and treated and all questions answered.  S/p foot surgery left fifth metatarsal base fracture ORIF -Progressing as expected post-operatively. -XR: As above no acute postoperative  complication -WB Status: Nonweightbearing in cam boot -Sutures: To remain intact until next visit. -Medications: Percocet and ibuprofen for pain -Foot redressed.  Leave dressing clean dry and intact until next visit do not get foot wet yet -Aspirin 81 mg twice daily for DVT prophylaxis  Return in about 1 week (around 08/27/2022).         Corinna Gab, DPM [facility] / [facility]

## 2022-08-22 ENCOUNTER — Encounter: Payer: 59 | Admitting: Podiatry

## 2022-08-27 ENCOUNTER — Ambulatory Visit (INDEPENDENT_AMBULATORY_CARE_PROVIDER_SITE_OTHER): Payer: 59 | Admitting: Podiatry

## 2022-08-27 DIAGNOSIS — S99192S Other physeal fracture of left metatarsal, sequela: Secondary | ICD-10-CM

## 2022-08-27 DIAGNOSIS — Z9889 Other specified postprocedural states: Secondary | ICD-10-CM

## 2022-08-27 MED ORDER — AMOXICILLIN-POT CLAVULANATE 875-125 MG PO TABS: 1.0000 | ORAL_TABLET | Freq: Two times a day (BID) | ORAL | 0 refills | Status: DC

## 2022-08-27 MED ORDER — OXYCODONE-ACETAMINOPHEN 5-325 MG PO TABS: 1.0000 | ORAL_TABLET | ORAL | 0 refills | Status: AC | PRN

## 2022-08-27 NOTE — Progress Notes (Signed)
  Subjective:  Patient ID: Tyler Rangel, male    DOB: 1960-12-21,  MRN: [id number]  Chief Complaint  Patient presents with   Routine Post Op    POV # 2 DOS 08/14/22 --- LEFT FOOT 5TH MET BASE FRACTURE ORIF    DOS: 08/14/2022 Procedure: Open reduction internal fixation of left fifth metatarsal base fracture  62 y.o. male returns for post-op check.  Patient presents for postop visit 2 week status post left foot open reduction and fixation with plate fixation of left fifth metatarsal base fracture.  He has been nonweightbearing to the left foot in a cam boot.  Has kept dressing clean dry intact as instructed.  Using Percocet and ibuprofen for pain.  Review of Systems: Negative except as noted in the HPI. Denies N/V/F/Ch.   Objective:  There were no vitals filed for this visit. There is no height or weight on file to calculate BMI. Constitutional Well developed. Well nourished.  Vascular Foot warm and well perfused. Capillary refill normal to all digits.  Calf is soft and supple, no posterior calf or knee pain, negative Homans' sign  Neurologic Normal speech. Oriented to person, place, and time. Epicritic sensation to light touch grossly present bilaterally.  Dermatologic Skin healing well without signs of infection. Mild maceration of the incision line proximally. Mild erythema surrounding.   Orthopedic: Tenderness to palpation noted about the surgical site.   Multiple view plain film radiographs: Deferred Assessment:   1. Post-operative state   2. Closed fracture of base of fifth metatarsal bone of left foot at metaphyseal-diaphyseal junction, sequela     Plan:  Patient was evaluated and treated and all questions answered.  S/p foot surgery left fifth metatarsal base fracture ORIF -Progressing as expected post-operatively. -XR: As above no acute postoperative complication -WB Status: Nonweightbearing in cam boot -Sutures: To remain intact until next visit. -Medications: Percocet  and ibuprofen for pain -Foot redressed.  Recommend every other day Betadine wet-to-dry dressings to dry out the tissue at the incision site -E Rx for Augmentin 875-125 mg twice daily for the next 10 days -Aspirin 81 mg twice daily for DVT prophylaxis  Return in about 2 weeks (around 09/10/2022) for Third postop visit left fifth met base ORIF.         Corinna Gab, DPM [facility] / [facility]

## 2022-09-05 ENCOUNTER — Encounter: Payer: 59 | Admitting: Podiatry

## 2022-09-06 ENCOUNTER — Telehealth: Payer: Self-pay | Admitting: Podiatry

## 2022-09-06 MED ORDER — DOXYCYCLINE HYCLATE 100 MG PO CAPS: 100.0000 mg | ORAL_CAPSULE | Freq: Two times a day (BID) | ORAL | 0 refills | Status: AC

## 2022-09-06 NOTE — Telephone Encounter (Signed)
Patient called this morning. He is p/o from 5th met. Fracture ORIF.  States the last 2" of his incision is wet/macerated.  Dr. Annamary Rummage told him to put iodine on it, but pt. Felt it needed regular abx ointment some days.  He's changing dressing daily, taking the Augmentin (has 2 days left), icing, elevating, using crutches.  Denies injury.  Taking ibuprofen and oxycodone to manage pain.  Denies fever/N/V.  States this morning the foot was more painful and swollen.  Advised patient to d/c any other abx ointment like neosporin for now.  Do the Iodine every day and cover with gauze dressing (no bandaids), and wear boot.  Will add doxycycline to his abx regimen until he can be seen by Dr. Annamary Rummage on Tuesday.    Patient expressed understanding and will finish the Augmentin, and will start the doxycycline today.

## 2022-09-11 ENCOUNTER — Other Ambulatory Visit: Payer: Self-pay | Admitting: Podiatry

## 2022-09-16 ENCOUNTER — Ambulatory Visit (INDEPENDENT_AMBULATORY_CARE_PROVIDER_SITE_OTHER): Payer: 59

## 2022-09-16 ENCOUNTER — Ambulatory Visit (INDEPENDENT_AMBULATORY_CARE_PROVIDER_SITE_OTHER): Payer: 59 | Admitting: Podiatry

## 2022-09-16 DIAGNOSIS — S99192S Other physeal fracture of left metatarsal, sequela: Secondary | ICD-10-CM

## 2022-09-16 DIAGNOSIS — Z9889 Other specified postprocedural states: Secondary | ICD-10-CM

## 2022-09-16 MED ORDER — GABAPENTIN 300 MG PO CAPS: 300.0000 mg | ORAL_CAPSULE | Freq: Three times a day (TID) | ORAL | 1 refills | Status: DC

## 2022-09-16 MED ORDER — DOXYCYCLINE HYCLATE 100 MG PO TABS: 100.0000 mg | ORAL_TABLET | Freq: Two times a day (BID) | ORAL | 0 refills | Status: AC

## 2022-09-16 MED ORDER — OXYCODONE-ACETAMINOPHEN 5-325 MG PO TABS: 1.0000 | ORAL_TABLET | ORAL | 0 refills | Status: AC | PRN

## 2022-09-16 NOTE — Progress Notes (Signed)
  Subjective:  Patient ID: Tyler Rangel, male    DOB: Jul 09, 1960,  MRN: [id number]  Chief Complaint  Patient presents with   Routine Post Op    POV #3 DOS 08/14/22 --- LEFT FOOT 5TH MET BASE FRACTURE ORIF    DOS: 08/14/2022 Procedure: Open reduction internal fixation of left fifth metatarsal base fracture  62 y.o. male returns for post-op check.  Patient presents for postop visit 4 week status post left foot open reduction and fixation with plate fixation of left fifth metatarsal base fracture.  He has been nonweightbearing to the left foot in a cam boot.  Has been applying Betadine.  Has seen some drainage from the proximal aspect of the incision.  It is improving from prior though after he was placed on doxycycline by Dr. Carlota Raspberry.  He says that he has a lot of burning pain in the left foot near the incision and where the sutures are.  Review of Systems: Negative except as noted in the HPI. Denies N/V/F/Ch.   Objective:  There were no vitals filed for this visit. There is no height or weight on file to calculate BMI. Constitutional Well developed. Well nourished.  Vascular Foot warm and well perfused. Capillary refill normal to all digits.  Calf is soft and supple, no posterior calf or knee pain, negative Homans' sign  Neurologic Normal speech. Oriented to person, place, and time. Epicritic sensation to light touch grossly present bilaterally.  Dermatologic Skin healing well without signs of infection.  Decreased and no maceration present there is dry eschar present at the proximal aspect of the incision which appears fully closed.  There is mild erythema however decreased prior  Orthopedic: Tenderness to palpation noted about the surgical site.  Patient reports burning and shooting pains to this area.   Multiple view plain film radiographs: Deferred Assessment:   1. Post-operative state   2. Closed fracture of base of fifth metatarsal bone of left foot at metaphyseal-diaphyseal junction,  sequela      Plan:  Patient was evaluated and treated and all questions answered.  S/p foot surgery left fifth metatarsal base fracture ORIF -Progressing as expected post-operatively.  Patient has some burning and shooting pains at the incision site likely due to nerve irritation from the plate or the sutures.  The sutures were removed today hopefully that will help -E Rx for gabapentin 300 mg take 3 times daily for nerve pain discussed the risk and benefits including drowsiness this medication -XR: As above no acute postoperative complication, improved osseous healing seen from prior x-ray -WB Status: Nonweightbearing in cam boot -Sutures:  Removed this visit in total -Medications: Percocet and ibuprofen for pain, refill of Percocet sent -Foot redressed.  Recommend every other day Betadine wet-to-dry dressings to dry out the tissue at the incision site -E Rx for doxycycline 100 mg twice daily for the next 14 days -Aspirin 81 mg twice daily for DVT prophylaxis  Return in about 2 weeks (around 09/30/2022) for Fourth postop visit left foot fifth met ORIF.         Corinna Gab, DPM [facility] / [facility]

## 2022-09-26 ENCOUNTER — Encounter: Payer: 59 | Admitting: Podiatry

## 2022-09-30 ENCOUNTER — Ambulatory Visit (INDEPENDENT_AMBULATORY_CARE_PROVIDER_SITE_OTHER): Payer: 59 | Admitting: Podiatry

## 2022-09-30 ENCOUNTER — Ambulatory Visit (INDEPENDENT_AMBULATORY_CARE_PROVIDER_SITE_OTHER): Payer: 59

## 2022-09-30 DIAGNOSIS — Z9889 Other specified postprocedural states: Secondary | ICD-10-CM

## 2022-09-30 MED ORDER — DOXYCYCLINE HYCLATE 100 MG PO TABS: 100.0000 mg | ORAL_TABLET | Freq: Two times a day (BID) | ORAL | 0 refills | Status: AC

## 2022-09-30 MED ORDER — OXYCODONE-ACETAMINOPHEN 5-325 MG PO TABS: 1.0000 | ORAL_TABLET | ORAL | 0 refills | Status: AC | PRN

## 2022-09-30 NOTE — Progress Notes (Unsigned)
Subjective:  Patient ID: Tyler Rangel, male    DOB: February 26, 1961,  MRN: [id number]  Chief Complaint  Patient presents with   Routine Post Op    POV #4 DOS 08/14/22 --- LEFT FOOT 5TH MET BASE FRACTURE ORIF- patient continues to experience burning pain to the incision site. Patient is taking Gabapentin as ordered.     DOS: 08/14/2022 Procedure: Open reduction internal fixation of left fifth metatarsal base fracture  62 y.o. male returns for post-op check.  Patient presents for postop visit 6 week status post left foot open reduction and fixation with plate fixation of left fifth metatarsal base fracture.  Has been applying Betadine to the wound at the proximal incision.  States it is looking better, It is improving from prior, has been on doxycycline.  Patient reports that he is still having a lot of pain associate with any touch around the incision site.  Feels like a burning pain possibly nerve pain.  Does feel like the gabapentin is helping is taking 300 mg x 2 pills at night and 1 in the morning. Denies nausea vomiting fever chills.  Review of Systems: Negative except as noted in the HPI. Denies N/V/F/Ch.   Objective:  There were no vitals filed for this visit. There is no height or weight on file to calculate BMI. Constitutional Well developed. Well nourished.  Vascular Foot warm and well perfused. Capillary refill normal to all digits.  Calf is soft and supple, no posterior calf or knee pain, negative Homans' sign  Neurologic Normal speech. Oriented to person, place, and time. Epicritic sensation to light touch grossly present bilaterally.  Dermatologic Decreased and no maceration present there is dry eschar with superficial  wound present at the proximal aspect of the incision.  There is mild erythema however decreased prior.   Orthopedic: Tenderness to palpation noted about the surgical site.  Patient reports burning and shooting pains to this area.   Multiple view plain film  radiographs: XR 3 views AP lateral bleak of the left foot weightbearing.  Findings: Attention directed to the fifth metatarsal there is noted to be increased osseous bridging across the fracture line however there is a screw that is present at the fracture line making it difficult to assess the healing.  On oblique view there is also possible erosion at the styloid process versus trauma from surgical hardware and drilling of the area when I attempted to place a single percutaneous screw.  Edema noted about the left lateral midfoot. Assessment:   1. Post-operative state      Plan:  Patient was evaluated and treated and all questions answered.  S/p foot surgery left fifth metatarsal base fracture ORIF -Continues to have significant pain at the fracture site and at the site of hardware placement.  Concern for possible nerve irritation on the sural nerve versus CRPS type issue versus infection versus nonunion pain. -Proceed with order CT scan of the left foot to assess the healing at the fracture site. -I also recommend getting ESR and CRP testing done to check for infection. -Continue doxycycline 100 mg twice daily for another 10 days.  Patient has been on 2 to 3 weeks of this already. -As there is concern for possible infection would like to place infectious disease referral as well for their opinion -Continue gabapentin 300 mg take 3 times daily for nerve pain discussed the risk and benefits including drowsiness this medication -XR: As above question osteo lysis at the styloid process however favor postsurgical versus  infectious process -Wound is improving with local wound care continue this with Betadine dressings to the area once daily -WB Status: Weightbearing as tolerated in cam boot -Medications: Percocet and ibuprofen for pain, refill of Percocet sent  No follow-ups on file.         Corinna Gab, DPM [facility] / [facility]

## 2022-10-01 LAB — SEDIMENTATION RATE: Sed Rate: 2 mm/hr (ref 0–30)

## 2022-10-01 LAB — C-REACTIVE PROTEIN: CRP: 4 mg/L (ref 0–10)

## 2022-10-14 ENCOUNTER — Ambulatory Visit (INDEPENDENT_AMBULATORY_CARE_PROVIDER_SITE_OTHER): Payer: 59

## 2022-10-14 ENCOUNTER — Ambulatory Visit (INDEPENDENT_AMBULATORY_CARE_PROVIDER_SITE_OTHER): Payer: 59 | Admitting: Podiatry

## 2022-10-14 DIAGNOSIS — S99192S Other physeal fracture of left metatarsal, sequela: Secondary | ICD-10-CM

## 2022-10-14 DIAGNOSIS — Z9889 Other specified postprocedural states: Secondary | ICD-10-CM | POA: Diagnosis not present

## 2022-10-14 MED ORDER — GABAPENTIN 300 MG PO CAPS
300.0000 mg | ORAL_CAPSULE | Freq: Three times a day (TID) | ORAL | 3 refills | Status: DC
Start: 2022-10-14 — End: 2023-01-02

## 2022-10-14 NOTE — Progress Notes (Unsigned)
  Subjective:  Patient ID: Tyler Rangel, male    DOB: 01-07-1961,  MRN: [id number]  Chief Complaint  Patient presents with   Routine Post Op    POV #5 DOS 08/14/22 --- LEFT FOOT 5TH MET BASE FRACTURE ORIF    DOS: 08/14/2022 Procedure: Open reduction internal fixation of left fifth metatarsal base fracture  62 y.o. male returns for post-op check.  Patient presents for postop visit 8 week status post left foot open reduction and fixation with plate fixation of left fifth metatarsal base fracture.  Still having significant pain at the fracture site.  Describes the pain as burning shooting stabbing pains especially when he puts weight down and is not able to put his full weight down without the aid of a crutch.  Review of Systems: Negative except as noted in the HPI. Denies N/V/F/Ch.   Objective:  There were no vitals filed for this visit. There is no height or weight on file to calculate BMI. Constitutional Well developed. Well nourished.  Vascular Foot warm and well perfused. Capillary refill normal to all digits.  Calf is soft and supple, no posterior calf or knee pain, negative Homans' sign  Neurologic Normal speech. Oriented to person, place, and time. Epicritic sensation to light touch grossly present bilaterally.  Dermatologic Decreased and no maceration present there is dry eschar with superficial  wound present at the proximal aspect of the incision.  There is mild erythema however decreased prior.   Orthopedic: Tenderness to palpation noted about the surgical site.  Patient reports burning and shooting pains to this area.   Multiple view plain film radiographs: XR 3 views AP lateral bleak of the left foot weightbearing.  Findings: Attention directed to the fifth metatarsal there is noted to be increased osseous bridging across the fracture line however there is a screw that is present at the fracture line making it difficult to assess the healing.  On oblique view there is also  possible erosion at the styloid process versus trauma from surgical hardware and drilling of the area when I attempted to place a single percutaneous screw.  Edema noted about the left lateral midfoot. Assessment:   1. Post-operative state   2. Closed fracture of base of fifth metatarsal bone of left foot at metaphyseal-diaphyseal junction, sequela       Plan:  Patient was evaluated and treated and all questions answered.  S/p foot surgery left fifth metatarsal base fracture ORIF -Continues to have significant pain at the fracture site and at the site of hardware placement.  Concern for possible nerve irritation on the sural nerve versus CRPS type issue versus infection versus nonunion pain. -Patient has CT scan of the left foot scheduled for later this month to assess the healing at the fracture site. -Recent ESR and CRP testing were normal with sed rate of 2 and CRP of 4 -Defer further antibiotics at this time -Patient has a appointment scheduled with infectious disease coming up later this month as well -Continue gabapentin 300 mg take 3 times daily for nerve pain  -XR: As above question osteo lysis at the styloid process however favor postsurgical versus infectious process -Wound is improving with local wound care continue this with Betadine dressings to the area once daily -WB Status: Weightbearing as tolerated in cam boot -Medications: Percocet and ibuprofen for pain, refill of Percocet sent  No follow-ups on file.         Corinna Gab, DPM [facility] / [facility]

## 2022-10-24 ENCOUNTER — Ambulatory Visit
Admission: RE | Admit: 2022-10-24 | Discharge: 2022-10-24 | Disposition: A | Discharge status: Home / Self Care | Payer: 59 | Source: Ambulatory Visit | Attending: Podiatry | Admitting: Podiatry

## 2022-10-24 DIAGNOSIS — Z9889 Other specified postprocedural states: Secondary | ICD-10-CM

## 2022-10-28 ENCOUNTER — Encounter: Payer: Self-pay | Admitting: Podiatry

## 2022-10-28 ENCOUNTER — Ambulatory Visit (INDEPENDENT_AMBULATORY_CARE_PROVIDER_SITE_OTHER): Payer: 59 | Admitting: Podiatry

## 2022-10-28 DIAGNOSIS — S99192S Other physeal fracture of left metatarsal, sequela: Secondary | ICD-10-CM

## 2022-10-28 DIAGNOSIS — S92902K Unspecified fracture of left foot, subsequent encounter for fracture with nonunion: Secondary | ICD-10-CM

## 2022-10-28 DIAGNOSIS — Z9889 Other specified postprocedural states: Secondary | ICD-10-CM

## 2022-10-28 NOTE — Progress Notes (Signed)
Subjective:  Patient ID: Tyler Rangel, male    DOB: 1960-09-20,  MRN: [id number]  Chief Complaint  Patient presents with   Follow-up    POV #6 DOS 08/14/22 --- LEFT FOOT 5TH MET BASE FRACTURE ORIF-  CT scan completed. He is not taking Gabapentin to see how the pain is without the medication. He is to return to work August 1st. Wants to make sure he will not have extreme pain.     DOS: 08/14/2022 Procedure: Open reduction internal fixation of left fifth metatarsal base fracture  62 y.o. male returns for post-op check.  Patient presents for postop visit 10 weeks status post left foot open reduction and fixation with plate fixation of left fifth metatarsal base fracture.  Still having significant pain at the fracture site.  Describes the pain as burning shooting stabbing pains especially when he puts weight down and when he wear shoes.  He has pain right at the area of the prior fracture.  He says the wound that was present is now healing up nicely.  He recently had a CT scan completed the read is not done but he is here to go over the results and discuss further options.  Review of Systems: Negative except as noted in the HPI. Denies N/V/F/Ch.   Objective:  There were no vitals filed for this visit. There is no height or weight on file to calculate BMI. Constitutional Well developed. Well nourished.  Vascular Foot warm and well perfused. Capillary refill normal to all digits.  Calf is soft and supple, no posterior calf or knee pain, negative Homans' sign  Neurologic Normal speech. Oriented to person, place, and time. Epicritic sensation to light touch grossly present bilaterally.  Dermatologic Decreased and no maceration present there is dry eschar with superficial  wound present at the proximal aspect of the incision smaller from prior.  There is no erythema improved from prior.  Minimal to no edema present at this time however he says it does swell up throughout the day.   Orthopedic:  Tenderness to palpation noted about the surgical site.  Patient reports burning and shooting pains to this area.   10/24/2022 CT left foot without contrast nonweightbearing -personal read not from radiology- Attention directed to the fifth metatarsal base there is noted to be comminuted fracture of the fifth metatarsal base with apparent nonunion of the fracture line there is appears to be minimal to no osseous bridging noted across the fracture line.  One of the surgical screws through the plate does appear to be through the fracture line Assessment:   1. 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 Post-operative state   3. Closed fracture of base of fifth metatarsal bone of left foot at metaphyseal-diaphyseal junction, sequela      Plan:  Patient was evaluated and treated and all questions answered.  S/p foot surgery left fifth metatarsal base fracture ORIF -Continues to have significant pain at the fracture site and at the site of hardware placement though slightly improved from prior.   -Discussed with patient that based on the CT scan does appear he has nonunion which would explain most of his symptoms.  Will review the radiology read of the CT scan when available to confirm concern for nonunion -Discussed with the patient continued monitoring and giving him more time nonweightbearing versus proceeding with intervention.  I would prefer to proceed with a revision surgery at this time as I do not believe he will fully  healed without that surgery. -We recommend revision open reduction internal fixation of fifth metatarsal base fracture with possible peroneus brevis tendon transfer possible calcaneal bone graft harvest possible bone marrow aspiration from the distal tibia -We discussed risk benefits alternatives and possible complications associated with surgery.  Discussed the expected postoperative recovery course and need for prolonged nonweightbearing following  surgery. -Motion appreciated for shoulder consent was obtained at this visit we will begin surgical planning. -Continue gabapentin for pain control          Corinna Gab, DPM [facility] / [facility]

## 2022-10-28 NOTE — Progress Notes (Deleted)
Patient Active Problem List   Diagnosis Date Noted  . Encounter for orthopedic follow-up care 10/21/2018  . Impingement syndrome of shoulder region 06/09/2018  . Pain in joint of right shoulder 03/16/2018    Patient's Medications  New Prescriptions   No medications on file  Previous Medications   AMOXICILLIN-CLAVULANATE (AUGMENTIN) 875-125 MG TABLET    Take 1 tablet by mouth 2 (two) times daily.   ESOMEPRAZOLE (NEXIUM) 40 MG CAPSULE    Take 40 mg by mouth daily.   GABAPENTIN (NEURONTIN) 300 MG CAPSULE    Take 1 capsule (300 mg total) by mouth 3 (three) times daily.   GABAPENTIN (NEURONTIN) 300 MG CAPSULE    Take 1 capsule (300 mg total) by mouth 3 (three) times daily.   HYDROCHLOROTHIAZIDE (MICROZIDE) 12.5 MG CAPSULE    Take 12.5 mg by mouth daily.   IBUPROFEN (ADVIL) 800 MG TABLET    Take 1 tablet (800 mg total) by mouth every 8 (eight) hours as needed.   LORATADINE (CLARITIN) 10 MG TABLET    Take 10 mg by mouth daily.   MELOXICAM (MOBIC) 15 MG TABLET    TAKE ONE (1) TABLET BY MOUTH EVERY DAY  Modified Medications   No medications on file  Discontinued Medications   No medications on file    Subjective: ***   Review of Systems: ROS  No past medical history on file.  No past surgical history on file.  Social History   Tobacco Use  . Smoking status: Never  . Smokeless tobacco: Never  Substance Use Topics  . Alcohol use: Never    No family history on file.  Allergies  Allergen Reactions  . Hydrocodone     Health Maintenance  Topic Date Due  . HIV Screening  Never done  . Hepatitis C Screening  Never done  . DTaP/Tdap/Td (1 - Tdap) Never done  . Colonoscopy  Never done  . Zoster Vaccines- Shingrix (1 of 2) Never done  . COVID-19 Vaccine (1 - 2023-24 season) Never done  . INFLUENZA VACCINE  11/07/2022  . HPV VACCINES  Aged Out    Objective:  There were no vitals filed for this visit. There is no height or weight on file to calculate  BMI.  Physical Exam Constitutional:      Appearance: Normal appearance.  HENT:     Head: Normocephalic and atraumatic.      Mouth: Mucous membranes are moist.  Eyes:    Conjunctiva/sclera: Conjunctivae normal.     Pupils: Pupils are equal, round, and reactive to light.   Cardiovascular:     Rate and Rhythm: Normal rate and regular rhythm.     Heart sounds: No murmur heard. No friction rub. No gallop.   Pulmonary:     Effort: Pulmonary effort is normal.     Breath sounds: Normal breath sounds.   Abdominal:     General: Non distended     Palpations: soft.   Musculoskeletal:        General: Normal range of motion.   Skin:    General: Skin is warm and dry.     Comments:  Neurological:     General: grossly non focal     Mental Status: awake, alert and oriented to person, place, and time.   Psychiatric:        Mood and Affect: Mood normal.   Lab Results Lab Results  Component Value Date   WBC 7.5 07/25/2020  HGB 16.0 07/25/2020   HCT 45.9 07/25/2020   MCV 89.6 07/25/2020   PLT 250 07/25/2020    Lab Results  Component Value Date   CREATININE 1.10 07/25/2020   BUN 16 07/25/2020   NA 144 07/25/2020   K 4.0 07/25/2020   CL 106 07/25/2020   CO2 29 07/25/2020    Lab Results  Component Value Date   ALT 37 07/25/2020   AST 20 07/25/2020   BILITOT 0.7 07/25/2020    No results found for: "CHOL", "HDL", "LDLCALC", "LDLDIRECT", "TRIG", "CHOLHDL" No results found for: "LABRPR", "RPRTITER" No results found for: "HIV1RNAQUANT", "HIV1RNAVL", "CD4TABS"   Problem List Items Addressed This Visit   None   I have personally spent at least 60 minutes involved in face-to-face and non-face-to-face activities for this patient on the day of the visit. Professional time spent includes the following activities: Preparing to see the patient (review of tests), Obtaining and/or reviewing separately obtained history (admission/discharge record), Performing a medically appropriate  examination and/or evaluation , Ordering medications/tests/procedures, referring and communicating with other health care professionals, Documenting clinical information in the EMR, Independently interpreting results (not separately reported), Communicating results to the patient/family/caregiver, Counseling and educating the patient/family/caregiver and Care coordination (not separately reported).   Victoriano Lain, MD [facility] 10/28/2022, 11:35 AM

## 2022-10-29 ENCOUNTER — Ambulatory Visit: Payer: 59 | Admitting: Infectious Diseases

## 2022-11-06 ENCOUNTER — Telehealth: Payer: Self-pay | Admitting: Podiatry

## 2022-11-06 NOTE — Telephone Encounter (Signed)
[facility] PATIENT, CALLED PT TO SCHEDULE SURGERY. NO ANSWER, LFT VM

## 2022-11-11 ENCOUNTER — Telehealth: Payer: Self-pay | Admitting: Podiatry

## 2022-11-11 NOTE — Telephone Encounter (Signed)
DOS 11/15/2022  ORIF 5TH MET LT - 28485 BONE GRAFT 5TH LT - 20900  [facility] EFFECTIVE DATE - 02/06/2022  DED - $0 OOP - $500 W. $222 REMAINING COINS - 0%  PER [employer] WEBSITE, FOR CPT CODES 09811 AND 20900 - Notification or Prior Authorization is not required for the requested services Decision ID #: [id number] The number above acknowledges your inquiry and our response.

## 2022-11-12 ENCOUNTER — Encounter: Payer: Self-pay | Admitting: Podiatry

## 2022-11-15 ENCOUNTER — Other Ambulatory Visit: Payer: Self-pay | Admitting: Podiatry

## 2022-11-15 DIAGNOSIS — S92902K Unspecified fracture of left foot, subsequent encounter for fracture with nonunion: Secondary | ICD-10-CM

## 2022-11-15 MED ORDER — OXYCODONE-ACETAMINOPHEN 5-325 MG PO TABS: 1.0000 | ORAL_TABLET | ORAL | 0 refills | Status: AC | PRN

## 2022-11-15 MED ORDER — GABAPENTIN 300 MG PO CAPS: 300.0000 mg | ORAL_CAPSULE | Freq: Three times a day (TID) | ORAL | 3 refills | Status: DC

## 2022-11-15 MED ORDER — DOXYCYCLINE HYCLATE 100 MG PO TABS: 100.0000 mg | ORAL_TABLET | Freq: Two times a day (BID) | ORAL | 0 refills | Status: AC

## 2022-11-15 MED ORDER — IBUPROFEN 600 MG PO TABS: 600.0000 mg | ORAL_TABLET | Freq: Three times a day (TID) | ORAL | 0 refills | Status: DC | PRN

## 2022-11-15 NOTE — Progress Notes (Signed)
Post-op meds sent

## 2022-11-18 ENCOUNTER — Other Ambulatory Visit (INDEPENDENT_AMBULATORY_CARE_PROVIDER_SITE_OTHER): Payer: 59 | Admitting: Podiatry

## 2022-11-18 DIAGNOSIS — S92902K Unspecified fracture of left foot, subsequent encounter for fracture with nonunion: Secondary | ICD-10-CM

## 2022-11-18 NOTE — Progress Notes (Signed)
Bone stimulator order placed for Left foot 5th met fracture nonuion

## 2022-11-26 ENCOUNTER — Other Ambulatory Visit: Payer: Self-pay | Admitting: Podiatry

## 2022-11-26 ENCOUNTER — Ambulatory Visit (INDEPENDENT_AMBULATORY_CARE_PROVIDER_SITE_OTHER): Payer: 59 | Admitting: Podiatry

## 2022-11-26 ENCOUNTER — Ambulatory Visit (INDEPENDENT_AMBULATORY_CARE_PROVIDER_SITE_OTHER): Payer: 59

## 2022-11-26 DIAGNOSIS — S99192S Other physeal fracture of left metatarsal, sequela: Secondary | ICD-10-CM

## 2022-11-26 DIAGNOSIS — S92902K Unspecified fracture of left foot, subsequent encounter for fracture with nonunion: Secondary | ICD-10-CM | POA: Diagnosis not present

## 2022-11-26 DIAGNOSIS — Z9889 Other specified postprocedural states: Secondary | ICD-10-CM

## 2022-11-26 NOTE — Progress Notes (Unsigned)
  Subjective:  Patient ID: Tyler Rangel, male    DOB: 02/28/1961,  MRN: [id number]  Chief Complaint  Patient presents with   Routine Post Op    POV # 1 DOS 11/15/2022 --- LEFT FOOT REMOVAL OF HARDWARE, REVISION ORIF 5TH METATARSAL FRACTURE BONE GRAFT HARVEST, BONE MARROW ASPIRATION Patients states he has been taking his antibiotics    DOS: 11/15/2022 Procedure: Left foot fifth metatarsal ORIF revision with calcaneal bone graft harvest and bone marrow aspiration  62 y.o. male returns for post-op check.  Patient returns for first postoperative check 1 week status post above surgery.  He has been nonweightbearing in a posterior splint since surgery.  Cover dressing clean dry intact as instructed.  Review of Systems: Negative except as noted in the HPI. Denies N/V/F/Ch.   Objective:  There were no vitals filed for this visit. There is no height or weight on file to calculate BMI. Constitutional Well developed. Well nourished.  Vascular Foot warm and well perfused. Capillary refill normal to all digits.  Calf is soft and supple, no posterior calf or knee pain, negative Homans' sign  Neurologic Normal speech. Oriented to person, place, and time. Epicritic sensation to light touch grossly present bilaterally.  Dermatologic Skin healing well without signs of infection. Skin edges well coapted without signs of infection.  Orthopedic: Tenderness to palpation noted about the surgical site.   Multiple view plain film radiographs: 11/26/2022 x-ray 3 views AP lateral oblique of the left foot nonweightbearing.  Findings: Assessment:   1. Post-operative state   2. 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 Closed fracture of base of fifth metatarsal bone of left foot at metaphyseal-diaphyseal junction, sequela    Plan:  Patient was evaluated and treated and all questions answered.  S/p foot surgery left -Progressing as expected post-operatively. -XR: *** -WB Status:  *** in *** -Sutures: ***. -Medications: *** -Foot redressed.  No follow-ups on file.         Corinna Gab, DPM [facility] / [facility]

## 2022-12-03 ENCOUNTER — Encounter: Payer: Self-pay | Admitting: Podiatry

## 2022-12-03 ENCOUNTER — Ambulatory Visit: Payer: 59 | Admitting: Podiatry

## 2022-12-03 DIAGNOSIS — S92902K Unspecified fracture of left foot, subsequent encounter for fracture with nonunion: Secondary | ICD-10-CM

## 2022-12-03 DIAGNOSIS — Z9889 Other specified postprocedural states: Secondary | ICD-10-CM

## 2022-12-03 NOTE — Progress Notes (Signed)
  Subjective:  Patient ID: Tyler Rangel, male    DOB: 01-Mar-1961,  MRN: [id number]  Chief Complaint  Patient presents with   Routine Post Op    POV #2 DOS 11/15/2022 --- LEFT FOOT REMOVAL OF HARDWARE, REVISION ORIF 5TH METATARSAL FRACTURE BONE GRAFT HARVEST, BONE MARROW ASPIRATION    DOS: 11/15/2022 Procedure: Left foot fifth metatarsal ORIF revision with calcaneal bone graft harvest and bone marrow aspiration  62 y.o. male returns for post-op check.  Patient returns for postoperative check 2 week status post above surgery.  He has been nonweightbearing in a CAM boot since last apt. Cover dressing clean dry intact as instructed. Patient states he is feeling better this time , decreasing pain.   Review of Systems: Negative except as noted in the HPI. Denies N/V/F/Ch.   Objective:  There were no vitals filed for this visit. There is no height or weight on file to calculate BMI. Constitutional Well developed. Well nourished.  Vascular Foot warm and well perfused. Capillary refill normal to all digits.  Calf is soft and supple, no posterior calf or knee pain, negative Homans' sign  Neurologic Normal speech. Oriented to person, place, and time. Epicritic sensation to light touch grossly present bilaterally.  Dermatologic Skin healing well without signs of infection. Skin edges well coapted without signs of infection.  Orthopedic: Tenderness to palpation noted about the surgical site.   Multiple view plain film radiographs: 11/26/2022 x-ray 3 views AP lateral oblique of the left foot nonweightbearing.  Findings: lateral 5th metatarsal base plate and screws present. No displacement at the fracture line form immediate post op. Minimal to no gap at fracture line. Hardware intact with breakage or loosening.  Assessment:   1. Post-operative state   2. 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  S/p  foot surgery left - revision of nonunion of 5th metatarsal base, BMA, bone graft harvest. - Incision intact without maceration, no evidence of infection.   -Progressing as expected post-operatively.  Patient continuing to improve -XR: At this visit deferred -WB Status: Non weightbearing in CAM boot. Patient has one from prior surgery - Pathology from nonunion site was negative for osteomyelitis, positive for fragments of fibrous tissue and bone -Unfortunately the lab did not receive the bone from styloid process for culture in fast enough time to process the sample and therefore was discarded -Continuing to work on obtaining ultrasound assisted bone stimulator. In process of getting approval.  -Sutures: Removed in total at this visit -Medications: Finish doxycycline and no further antibiotics for now -Foot redressed with mupirocin and nonadherent gauze as well as gauze and Kerlix -Discussed with patient that he should remain nonweightbearing for the next 4 weeks at least.  I will see him back in 2 weeks. -Discussed with patient that he will be out of work until least February 03, 2023 given his current healing and postoperative recovery course as expected.  This is for the safety of the surgical construct and will facilitate osseous healing at the fracture site.  Return in about 2 weeks (around 12/17/2022) for f/u L foot fx.         Corinna Gab, DPM [facility] / [facility]

## 2022-12-04 ENCOUNTER — Telehealth: Payer: Self-pay | Admitting: Podiatry

## 2022-12-04 NOTE — Telephone Encounter (Signed)
Pt called and stated that he was supposed to be prescribed an antifungal cream for his post op. He said he is in pain and that he can't remember what the cream was called but would like a refill for it

## 2022-12-17 ENCOUNTER — Ambulatory Visit (INDEPENDENT_AMBULATORY_CARE_PROVIDER_SITE_OTHER): Payer: 59 | Admitting: Podiatry

## 2022-12-17 ENCOUNTER — Ambulatory Visit (INDEPENDENT_AMBULATORY_CARE_PROVIDER_SITE_OTHER): Payer: 59

## 2022-12-17 DIAGNOSIS — Z9889 Other specified postprocedural states: Secondary | ICD-10-CM

## 2022-12-17 DIAGNOSIS — S92902K Unspecified fracture of left foot, subsequent encounter for fracture with nonunion: Secondary | ICD-10-CM

## 2022-12-17 NOTE — Progress Notes (Unsigned)
Subjective:  Patient ID: Tyler Rangel, male    DOB: 03/24/1961,  MRN: [id number]  Chief Complaint  Patient presents with   Routine Post Op    POV #3 DOS 11/15/2022 --- LEFT FOOT REMOVAL OF HARDWARE, REVISION ORIF 5TH METATARSAL FRACTURE BONE GRAFT HARVEST, BONE MARROW ASPIRATION. Continues to have severe pain when he is walking for long period of time. He is taking the gabapentin as ordered. He is using the cam boot with ambulation.     DOS: 11/15/2022 Procedure: Left foot fifth metatarsal ORIF revision with calcaneal bone graft harvest and bone marrow aspiration  62 y.o. male returns for post-op check.  Patient returns for postoperative check 4 week status post above surgery.   Patient has been weightbearing in a cam boot against prior recommendations to remain nonweightbearing.  He says he wanted to test out how he is feeling when he walked on the foot in the cam boot.  Patient had significant pain after walking on the foot.  Has noticed some swelling as well.  He says the incision is healing  Review of Systems: Negative except as noted in the HPI. Denies N/V/F/Ch.   Objective:  There were no vitals filed for this visit. There is no height or weight on file to calculate BMI. Constitutional Well developed. Well nourished.  Vascular Foot warm and well perfused. Capillary refill normal to all digits.  Calf is soft and supple, no posterior calf or knee pain, negative Homans' sign  Neurologic Normal speech. Oriented to person, place, and time. Epicritic sensation to light touch grossly present bilaterally.  Dermatologic Skin healing well without signs of infection. Skin edges well coapted without signs of infection.  Small area of incomplete healing of the proximal aspect of the incision however improving from prior.  No erythema or drainage.  Orthopedic: Tenderness to palpation noted about the surgical site.   Multiple view plain film radiographs: 9-10 24  x-ray 3 views AP lateral oblique  of the left foot nonweightbearing.  Findings: lateral 5th metatarsal base plate and screws present.  Increased fracture gap approximately 1-2 mm seen on AP view.  Hardware is intact however.  No significant osseous bridging noted on these views at this time however looks better on AP oblique and lateral then on AP Assessment:   1. Post-operative state   2. 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  S/p foot surgery left - revision of nonunion of 5th metatarsal base, BMA, bone graft harvest. - Incision improving without evidence of infection continue to apply mupirocin ointment or Betadine and cover with Band-Aid -Patient unfortunately ambulated against prior recommendations to remain nonweightbearing.  Discussed that this can lead to residual fracture complication and repeat nonunion if he fails to stay off the foot -XR: As above no significant complication noted however there is a small fracture gap increased from prior seen on AP view though without significant displacement of the fracture line -WB Status: Non weightbearing in CAM boot.  Dispensed a secondary cam boot to the patient this visit as his prior 1 was damaged beyond repair due to excessive use. -Patient has ultrasound bone stimulator approved and has a and has possession.  Recommend he begin using that as per the manufacture recommendation -Medications: Gabapentin for pain control no further antibiotics indicated at this time -Foot redressed with mupirocin and adhesive bandage   Return in about 2 weeks (around 12/31/2022) for  POV L 5th met fx.         Corinna Gab, DPM [facility] / [facility]

## 2023-01-02 ENCOUNTER — Other Ambulatory Visit: Payer: Self-pay | Admitting: Podiatry

## 2023-01-06 ENCOUNTER — Ambulatory Visit (INDEPENDENT_AMBULATORY_CARE_PROVIDER_SITE_OTHER): Payer: 59 | Admitting: Podiatry

## 2023-01-06 DIAGNOSIS — S92902K Unspecified fracture of left foot, subsequent encounter for fracture with nonunion: Secondary | ICD-10-CM

## 2023-01-06 DIAGNOSIS — Z9889 Other specified postprocedural states: Secondary | ICD-10-CM

## 2023-01-06 MED ORDER — GABAPENTIN 300 MG PO CAPS: 300.0000 mg | ORAL_CAPSULE | Freq: Three times a day (TID) | ORAL | 3 refills | Status: DC

## 2023-01-06 NOTE — Progress Notes (Signed)
Subjective:  Patient ID: Tyler Rangel, male    DOB: June 15, 1960,  MRN: [id number]  Chief Complaint  Patient presents with   Routine Post Op    POV #4 DOS 11/15/2022 --- LEFT FOOT REMOVAL OF HARDWARE, REVISION ORIF 5TH METATARSAL FRACTURE BONE GRAFT HARVEST, BONE MARROW ASPIRATION    DOS: 11/15/2022 Procedure: Left foot fifth metatarsal ORIF revision with calcaneal bone graft harvest and bone marrow aspiration  62 y.o. male returns for post-op check.  Patient returns for postoperative check 7 week status post above surgery.    Patient has been weightbearing in cam boot.  Patient reports that his pain is much improved from prior.  He has been taking gabapentin and says that is helping a lot as well.  Overall his pain is only occasional and he will have a sharp shooting pain to the area at times.  He is able to walk in the boot without significant pain he says that walking in regular shoes has still caused a little bit of pain now when he is trying to do that.  Review of Systems: Negative except as noted in the HPI. Denies N/V/F/Ch.   Objective:  There were no vitals filed for this visit. There is no height or weight on file to calculate BMI. Constitutional Well developed. Well nourished.  Vascular Foot warm and well perfused. Capillary refill normal to all digits.  Calf is soft and supple, no posterior calf or knee pain, negative Homans' sign  Neurologic Normal speech. Oriented to person, place, and time. Epicritic sensation to light touch grossly present bilaterally.  Dermatologic Skin healing well without signs of infection. Skin edges well coapted without signs of infection.  Prior area of incomplete healing of the proximal incision is now fully healed with mild hyperkeratotic callus overlying.  This is debriding there is no underlying wound.  Orthopedic: Decreased tenderness to palpation noted about the surgical site.   Multiple view plain film radiographs: Deferred at this visit will  take new x-rays at next appointment Assessment:   1. Post-operative state   2. 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  S/p foot surgery left - revision of nonunion of 5th metatarsal base, BMA, bone graft harvest. - Incision fully healed at this time.  Can use scar care lotion or vitamin E lotion and massage area for desensitization purposes -Patient is improved at this time despite ambulating in the cam boot earlier than intended.  He is overall having much less pain and thinks that the bone stimulator he is using is helping. -XR: Deferred at this visit will take new x-rays at next appointment -WB Status: Weightbearing as tolerated at this point in CAM boot.   -Patient has ultrasound bone stimulator and recommend he continue using this as per manufacture guidelines -Medications: Gabapentin for pain control no antibiotics indicated -No dressing needed for the left foot. -Do recommend compressive style ankle sleeve for the left lower extremity for pain control   Return in about 29 days (around 02/04/2023) for POV #5.         Corinna Gab, DPM [facility] / [facility]

## 2023-01-13 ENCOUNTER — Ambulatory Visit (INDEPENDENT_AMBULATORY_CARE_PROVIDER_SITE_OTHER): Payer: 59

## 2023-01-13 ENCOUNTER — Encounter: Payer: Self-pay | Admitting: Podiatry

## 2023-01-13 ENCOUNTER — Ambulatory Visit (INDEPENDENT_AMBULATORY_CARE_PROVIDER_SITE_OTHER): Payer: 59 | Admitting: Podiatry

## 2023-01-13 ENCOUNTER — Other Ambulatory Visit: Payer: Self-pay | Admitting: Podiatry

## 2023-01-13 ENCOUNTER — Other Ambulatory Visit: Payer: Self-pay

## 2023-01-13 DIAGNOSIS — S92902K Unspecified fracture of left foot, subsequent encounter for fracture with nonunion: Secondary | ICD-10-CM

## 2023-01-13 DIAGNOSIS — L02619 Cutaneous abscess of unspecified foot: Secondary | ICD-10-CM

## 2023-01-13 DIAGNOSIS — L03119 Cellulitis of unspecified part of limb: Secondary | ICD-10-CM

## 2023-01-13 DIAGNOSIS — Z9889 Other specified postprocedural states: Secondary | ICD-10-CM | POA: Diagnosis not present

## 2023-01-13 MED ORDER — GABAPENTIN 400 MG PO CAPS: 400.0000 mg | ORAL_CAPSULE | Freq: Three times a day (TID) | ORAL | 2 refills | Status: DC

## 2023-01-13 MED ORDER — AMOXICILLIN-POT CLAVULANATE 875-125 MG PO TABS: 1.0000 | ORAL_TABLET | Freq: Two times a day (BID) | ORAL | 0 refills | Status: DC

## 2023-01-13 MED ORDER — OXYCODONE-ACETAMINOPHEN 5-325 MG PO TABS: 1.0000 | ORAL_TABLET | ORAL | 0 refills | Status: AC | PRN

## 2023-01-13 NOTE — Progress Notes (Unsigned)
Subjective:  Patient ID: Tyler Rangel, male    DOB: 10/19/1960,  MRN: [id number]  No chief complaint on file.   DOS: 11/15/2022 Procedure: Left foot fifth metatarsal ORIF revision with calcaneal bone graft harvest and bone marrow aspiration  62 y.o. male returns for post-op check.  Patient returns for postoperative check 7 week status post above surgery.    Patient has been weightbearing in cam boot.  Patient reports that his pain is much improved from prior.  He has been taking gabapentin and says that is helping a lot as well.  Overall his pain is only occasional and he will have a sharp shooting pain to the area at times.  He is able to walk in the boot without significant pain he says that walking in regular shoes has still caused a little bit of pain now when he is trying to do that.  Review of Systems: Negative except as noted in the HPI. Denies N/V/F/Ch.   Objective:  There were no vitals filed for this visit. There is no height or weight on file to calculate BMI. Constitutional Well developed. Well nourished.  Vascular Foot warm and well perfused. Capillary refill normal to all digits.  Calf is soft and supple, no posterior calf or knee pain, negative Homans' sign  Neurologic Normal speech. Oriented to person, place, and time. Epicritic sensation to light touch grossly present bilaterally.  Dermatologic Skin healing well without signs of infection. Skin edges well coapted without signs of infection.  Prior area of incomplete healing of the proximal incision is now fully healed with mild hyperkeratotic callus overlying.  This is debriding there is no underlying wound.  Orthopedic: Decreased tenderness to palpation noted about the surgical site.   Multiple view plain film radiographs: Deferred at this visit will take new x-rays at next appointment Assessment:   1. Post-operative state   2. 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  S/p foot surgery left - revision of nonunion of 5th metatarsal base, BMA, bone graft harvest. - Incision fully healed at this time.  Can use scar care lotion or vitamin E lotion and massage area for desensitization purposes -Patient is improved at this time despite ambulating in the cam boot earlier than intended.  He is overall having much less pain and thinks that the bone stimulator he is using is helping. -XR: Deferred at this visit will take new x-rays at next appointment -WB Status: Weightbearing as tolerated at this point in CAM boot.   -Patient has ultrasound bone stimulator and recommend he continue using this as per manufacture guidelines -Medications: Gabapentin for pain control no antibiotics indicated -No dressing needed for the left foot. -Do recommend compressive style ankle sleeve for the left lower extremity for pain control   No follow-ups on file.         Corinna Gab, DPM [facility] / [facility]

## 2023-01-20 ENCOUNTER — Ambulatory Visit (INDEPENDENT_AMBULATORY_CARE_PROVIDER_SITE_OTHER): Payer: 59 | Admitting: Podiatry

## 2023-01-20 ENCOUNTER — Encounter: Payer: Self-pay | Admitting: Podiatry

## 2023-01-20 DIAGNOSIS — S92902K Unspecified fracture of left foot, subsequent encounter for fracture with nonunion: Secondary | ICD-10-CM

## 2023-01-20 DIAGNOSIS — L03119 Cellulitis of unspecified part of limb: Secondary | ICD-10-CM

## 2023-01-20 DIAGNOSIS — Z9889 Other specified postprocedural states: Secondary | ICD-10-CM

## 2023-01-20 DIAGNOSIS — L02619 Cutaneous abscess of unspecified foot: Secondary | ICD-10-CM

## 2023-01-20 NOTE — Progress Notes (Signed)
Subjective:  Patient ID: Tyler Rangel, male    DOB: 11-14-1960,  MRN: [id number]  Chief Complaint  Patient presents with   Post-op Problem    Left 5th met base ORIF surgical site infection. Augmentin improved skin redness, swelling. Very tender over proximal incision. Cx grew MSSA. Seeing ID 10/29    62 y.o. 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  Patient was placed on Augmentin 875-125 mg twice daily for 10 days after last appointment.  He has been taking this medication with some stomach discomfort.  Referral was placed to infectious disease.  Has an appointment with them upcoming on October 29th.  Cultures taken at last appointment with MSSA. Reports wound healed up since being on abx. Patient returning primarily to discuss surgical plan.   No past medical history on file.  Allergies  Allergen Reactions   Hydrocodone     ROS: Negative except as per HPI above  Objective:   Constitutional Well developed. Well nourished.  Vascular Foot warm and well perfused. Capillary refill normal to all digits.  Calf is soft and supple, no posterior calf or knee pain, negative Homans' sign  Neurologic Normal speech. Oriented to person, place, and time. Epicritic sensation to light touch grossly present bilaterally.  Dermatologic At the proximal aspect of the incision site there is severe pain with palpation  but overall improved clinically with less redness and wound now closed  with eschar at side of superficial dehisence/abscess which was drained at last visit.    Orthopedic: Severe pain with palpation at the surgical site   Multiple view plain film radiographs: 01/13/2023 XR 3 views AP lateral oblique weightbearing left foot.  Findings: Attention directed to the fifth metatarsal base there is noted to be increased gapping of the fracture line worse from prior with evidence of complete nonunion of the surgical site  no interval osseous healing worse from prior radiographs.  Questionable osteolysis at the styloid process as well as hardware loosening of the distal screws with peri-hardware lucencies.  Concern for infection as well as nonunion Assessment:   1. Post-operative state   2. 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 Cellulitis and abscess of foot excluding toe      Plan:  Patient was evaluated and treated and all questions answered.  # Status post revision of the fifth metatarsal base nonunion with bone marrow aspirate and calcaneal bone graft harvest. -Now with concern for residual malunion as well as infection in the fifth metatarsal base -Overall he is improved since prior visit in regards to surgical site infection. Still cannot rule out underlying infection of 5th met base. - Discussed at length concerns for both nonunion of fx s/p repeat ORIF and also concern for underlying infeciton - Patient has upcoming apt with infectious disease on 10/29.  -At this time in regards to both nonunion and concern for osteomyelitis of the fifth metatarsal I recommended the patient we proceed with hardware removal irrigation and debridement of the surgical site, multiple bone biopsy fifth metatarsal base for pathology and culture, antibiotic bead application possible wound VAC application.  This would be the first surgery of a two-stage procedure with the second stage including further debridement and resection of infected bone based on culture and pathology data with plan to transfer the peroneus brevis tendon to the cuboid.  Discussed the procedure with him as well as the expected need for secondary surgery at  this time.  Also discussed that patient would potentially be admitted between the first and second surgery to allow for IV antibiotic therapy wait for the cultures and biopsy results to come back before proceeding with a second surgery on the same admission. -Discussed risk  benefits alternatives and possible complications associated with his surgery as well as expected postoperative recovery course.  Patient should expect to be out from work for another 2 to 3 months at a minimum following the next procedure. -Difficult situation overall and offered the patient a referral or second opinion in regards to my surgical plan.  I have discussed this plan with multiple other providers in our group.  Patient deferred second opinion referral and wishes to proceed with surgery at this time.  Informed surgical consent was obtained with no guarantee as to outcome given or implied.  Will begin with surgical planning -For now continue weightbearing as tolerated in cam boot and crutches or knee scooter as needed for pain -Can discontinue use of any oral antibiotics at this time if there is worsening of the surgical site I would put him back on some until we can proceed with surgery         Corinna Gab, DPM [facility] / [facility]

## 2023-01-21 LAB — SPECIMEN STATUS REPORT

## 2023-01-21 LAB — AEROBIC CULTURE

## 2023-01-21 LAB — SEDIMENTATION RATE: Sed Rate: 2 mm/h (ref 0–30)

## 2023-01-21 LAB — VITAMIN D 25 HYDROXY (VIT D DEFICIENCY, FRACTURES): Vit D, 25-Hydroxy: 16.5 ng/mL — ABNORMAL LOW (ref 30.0–100.0)

## 2023-01-21 LAB — C-REACTIVE PROTEIN: CRP: 5 mg/L (ref 0–10)

## 2023-01-22 ENCOUNTER — Ambulatory Visit: Payer: 59 | Admitting: Infectious Diseases

## 2023-01-23 ENCOUNTER — Other Ambulatory Visit (INDEPENDENT_AMBULATORY_CARE_PROVIDER_SITE_OTHER): Payer: 59 | Admitting: Podiatry

## 2023-01-23 DIAGNOSIS — S92902K Unspecified fracture of left foot, subsequent encounter for fracture with nonunion: Secondary | ICD-10-CM

## 2023-01-23 DIAGNOSIS — Z9889 Other specified postprocedural states: Secondary | ICD-10-CM

## 2023-01-23 MED ORDER — VITAMIN D (ERGOCALCIFEROL) 1.25 MG (50000 UNIT) PO CAPS: 50000.0000 [IU] | ORAL_CAPSULE | ORAL | 0 refills | Status: DC

## 2023-01-23 NOTE — Progress Notes (Signed)
Vitamin D supplement sent

## 2023-01-24 ENCOUNTER — Encounter: Payer: Self-pay | Admitting: Physical Medicine and Rehabilitation

## 2023-02-03 NOTE — Progress Notes (Signed)
Patient Active Problem List   Diagnosis Date Noted   Encounter for orthopedic follow-up care 10/21/2018   Impingement syndrome of shoulder region 06/09/2018   Pain in joint of right shoulder 03/16/2018   Current Outpatient Medications on File Prior to Visit  Medication Sig Dispense Refill   esomeprazole (NEXIUM) 40 MG capsule Take 40 mg by mouth daily.     gabapentin (NEURONTIN) 400 MG capsule Take 1 capsule (400 mg total) by mouth 3 (three) times daily. 90 capsule 2   hydrochlorothiazide (MICROZIDE) 12.5 MG capsule Take 12.5 mg by mouth daily.     ibuprofen (ADVIL) 600 MG tablet Take 1 tablet (600 mg total) by mouth every 8 (eight) hours as needed. 30 tablet 0   loratadine (CLARITIN) 10 MG tablet Take 10 mg by mouth daily.     meloxicam (MOBIC) 15 MG tablet TAKE ONE (1) TABLET BY MOUTH EVERY DAY 30 tablet 3   Vitamin D, Ergocalciferol, (DRISDOL) 1.25 MG (50000 UNIT) CAPS capsule Take 1 capsule (50,000 Units total) by mouth every 7 (seven) days for 6 doses. 6 capsule 0   amoxicillin-clavulanate (AUGMENTIN) 875-125 MG tablet Take 1 tablet by mouth 2 (two) times daily. (Patient not taking: Reported on 02/04/2023) 20 tablet 0   gabapentin (NEURONTIN) 300 MG capsule Take 1 capsule (300 mg total) by mouth 3 (three) times daily. 90 capsule 1   No current facility-administered medications on file prior to visit.    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  He had a fall early August, resulting in a fracture of the left lateral  foot. Approximately a week after the incident, the patient began experiencing severe pain running up their leg, prompting him to see Podiatry Dr Lia Foyer who had a Xray done and confirmed #. He was given boot to wear but then fractured it again. He underwent left foot open reduction with plate fixation of left fifth metatarsal  base fracture 5/8. 5/21 - prescribed augmentin for 10 days although no signs of infection. 6/10 - doxycycline 100 mg twice daily for the next 14 days. Continued to have significant pain at the # site. 6/24-  doxycycline 100 mg twice daily for another 10 days .  CT 7/18  IMPRESSION: 1. Nondisplaced, ununited fracture at the base of the fifth metatarsal transfixed with a lateral sideplate and interlocking screws. No significant bridging callus formation. Osteolysis along the plantar aspect of the proximal fifth metatarsal adjacent to the fracture cleft small concerning for osteomyelitis. 2. Small area of erosive changes at the proximal most aspect of the fifth metatarsal which may reflect postsurgical changes versus osteomyelitis.  He then underwent Left foot 5th metatarsal ORIF revision with calcaneal bone graft harvest  and bone marrow aspiration on 8/9 given persistent pain. Pathology from non union site negative for osteomyelitis , positive for fragments of tissue and bone. Cx was discarded by lab as did not receive specimen on time.  He was taking PO doxycycline when seen on 8/20.   9/10 visit- Patient unfortunately ambulated against recommendations for NWB leading to #.  Xray with small # gap increased. He developed a small abscess at the incision site which was drained on 10/7 ( cx MSSA) and was prescribed 10 days of PO augmentin. Xray with evidence of hardware loosening infection and non union at the 5th metatarsal # line  Per last  Podiatry note 10/14, Plan  for removal of infected hardware in total with multiple bone biopsies and cultures taken from the fifth metatarsal, insert antibiotic beads in the first surgery. Secondarily depending on the results of the pathology and cultures consider resection of fifth metatarsal and partial or total depending on the extent of infection with transfer of the peroneus brevis tendon to the cuboid. This per patient scheduled early next week. Last dose of  abtx approx 2 weeks ago  Lives with wife Denies smoking, alcohol and IVDU He reports h/o bad arthritis and h/o steroid injections in the rt foot but not in the left Denies h/o DM or vascular disease    Review of Systems: Denies fevers, chills. Denies nausea, vomiting, diarrhea. Denies GU symptoms rashes   No past medical history on file.  No past surgical history on file.   Social History   Tobacco Use   Smoking status: Never   Smokeless tobacco: Never  Substance Use Topics   Alcohol use: Never    No family history on file.  Allergies  Allergen Reactions   Hydrocodone     Health Maintenance  Topic Date Due   HIV Screening  Never done   Hepatitis C Screening  Never done   DTaP/Tdap/Td (1 - Tdap) Never done   Colonoscopy  Never done   Zoster Vaccines- Shingrix (1 of 2) Never done   INFLUENZA VACCINE  Never done   COVID-19 Vaccine (1 - 2023-24 season) Never done   HPV VACCINES  Aged Out    Objective: BP (!) 152/93   Pulse 75   Temp 98.1 F (36.7 C) (Temporal)   Ht 5\' 6"  (1.676 m)   Wt 185 lb (83.9 kg)   SpO2 96%   BMI 29.86 kg/m    Physical Exam Constitutional:      Appearance: Normal appearance.  HENT:     Head: Normocephalic and atraumatic.      Mouth: Mucous membranes are moist.  Eyes:    Conjunctiva/sclera: Conjunctivae normal.     Pupils: Pupils are equal, round, and bilaterally symmetrical   Cardiovascular:     Rate and Rhythm: Normal rate and regular rhythm.     Heart sounds: s1s2  Pulmonary:     Effort: Pulmonary effort is normal.     Breath sounds: Normal breath sounds.   Abdominal:     General: Non distended     Palpations: soft.   Musculoskeletal:        General: Normal range of motion. Left lateral foot wound has closed with no active signs of infection or cellulitis. He is ambulatory   Skin:    General: Skin is warm and dry.     Comments:  Neurological:     General: grossly non focal     Mental Status: awake, alert and  oriented to person, place, and time.   Psychiatric:        Mood and Affect: Mood normal.   Lab Results Lab Results  Component Value Date   WBC 7.5 07/25/2020   HGB 16.0 07/25/2020   HCT 45.9 07/25/2020   MCV 89.6 07/25/2020   PLT 250 07/25/2020    Lab Results  Component Value Date   CREATININE 1.10 07/25/2020   BUN 16 07/25/2020   NA 144 07/25/2020   K 4.0 07/25/2020   CL 106 07/25/2020   CO2 29 07/25/2020    Lab Results  Component Value Date   ALT 37 07/25/2020   AST 20 07/25/2020  BILITOT 0.7 07/25/2020    No results found for: "CHOL", "HDL", "LDLCALC", "LDLDIRECT", "TRIG", "CHOLHDL" No results found for: "LABRPR", "RPRTITER" No results found for: "HIV1RNAQUANT", "HIV1RNAVL", "CD4TABS"   Assessment/Plan # left foot osteomyelitis complicated with hardware   - agree with plan for surgical intervention w hardware removal for curing infection  - no indication for abtx currently as no acute signs of infection/sepsis/would hold off abtx prior to surgical intervention for targeted tx postoperatively - consult ID team inpatient when admitted for further management of abtx - Fu to be made post OR   I have personally spent 61 minutes involved in face-to-face and non-face-to-face activities for this patient on the day of the visit. Professional time spent includes the following activities: Preparing to see the patient (review of tests), Obtaining and/or reviewing separately obtained history (admission/discharge record), Performing a medically appropriate examination and/or evaluation , Ordering medications/tests/procedures, referring and communicating with other health care professionals, Documenting clinical information in the EMR, Independently interpreting results (not separately reported), Communicating results to the patient/family/caregiver, Counseling and educating the patient/family/caregiver and Care coordination (not separately reported).   Of note, portions of this note  may have been created with voice recognition software. While this note has been edited for accuracy, occasional wrong-word or 'sound-a-like' substitutions may have occurred due to the inherent limitations of voice recognition software.   Victoriano Lain, MD [facility] 02/03/2023, 10:42 AM

## 2023-02-04 ENCOUNTER — Other Ambulatory Visit: Payer: Self-pay

## 2023-02-04 ENCOUNTER — Encounter: Payer: Self-pay | Admitting: Infectious Diseases

## 2023-02-04 ENCOUNTER — Ambulatory Visit: Payer: 59 | Admitting: Podiatry

## 2023-02-04 ENCOUNTER — Ambulatory Visit: Payer: 59 | Admitting: Infectious Diseases

## 2023-02-04 VITALS — BP 152/93 | HR 75 | Temp 98.1°F | Ht 66.0 in | Wt 185.0 lb

## 2023-02-04 DIAGNOSIS — Z029 Encounter for administrative examinations, unspecified: Secondary | ICD-10-CM | POA: Insufficient documentation

## 2023-02-04 DIAGNOSIS — Z79899 Other long term (current) drug therapy: Secondary | ICD-10-CM | POA: Diagnosis not present

## 2023-02-04 DIAGNOSIS — T847XXA Infection and inflammatory reaction due to other internal orthopedic prosthetic devices, implants and grafts, initial encounter: Secondary | ICD-10-CM | POA: Insufficient documentation

## 2023-02-04 DIAGNOSIS — Z5181 Encounter for therapeutic drug level monitoring: Secondary | ICD-10-CM | POA: Insufficient documentation

## 2023-02-07 ENCOUNTER — Telehealth: Payer: Self-pay | Admitting: Podiatry

## 2023-02-07 NOTE — Progress Notes (Unsigned)
Subjective:    Patient ID: Tyler Rangel, male    DOB: 1960/05/03, 62 y.o.   MRN: [id number]  HPI  HPI  Orest Dygert is a 62 y.o. year old male  who  has no past medical history on file.   They are presenting to PM&R clinic as a new patient for pain management evaluation. They were referred by Dr. Annamary Rummage, DPM for treatment of L ankle/foot pain.   Source: L foot, ankle; not also low back from compensatory walking Inciting incident: Had a fracture of his left ankle when he was walking his dogs by tripping on a large rock.   Description of pain: intermittent, sharp, burning, and stabbing Exacerbating factors: standing and activity Remitting factors: pain medication  Medications tried: Topical medications (no effect) : none now because can't over surgical site.  Nsaids (mild effect) : Meloxicam 15 mg daily; he has been on that for a a long time.  He takes goody powder occasionally. Denies any GI upset outside of being on PO antibiotics. Denies renal or heart issues. Last labs per chart 2022; he has an appointment coming up with his PCP and will ask about updating this.   Tylenol  (mild effect) : occassionally  Opiates  (mild effect) : Percocet 5 mg #30 tabs prescribed 10/7; he says he has 1/3 of these left. "I will take that if it starts to hurt me pretty bad".  He says he staill gets sharp pains throught this, morstly burning/stinging.   Never been on tramadol  Gabapentin / Lyrica  (mild effect) : Gabapentin 300 mg TID; he says it wears off too quickly though lasts about 4 hours; if he takes 2 it makes him tired. Hasn't tried lyrica.   TCAs  (never tried) :   SNRIs  (never tried) :   Other  (never tried) :   Other treatments: PT/OT  (never tried) : Never offered for his foot/ankle; has had for his left knee and hip. Is currently talking to Dr. Verna Czech about this because it is becoming an issue with getting his FMLA covered with work.   Accupuncture/chiropractor/massage   (moderate effect) : Went to chiropractor last week because walking with the boot has caused back problems; was very helpful.  TENs unit (never tried) :  Injections ( unable to because of infection ) :   Surgery (unsure of effect) :    R 5th MT surgery x2; next scheduled 11/20 after c/b osteomyelitis. Per the patient, he is cleared to weightbear through the foot with a CAM boot for limited ambulation, and is not using and assistive devices  Other  (unsure of effect) :   Goals for pain control: "I go to the kitchen and come back; the gabapentin works pretty good but the pain will just shoot through it and it gets pretty rough."   Prior UDS results: No results found for: "LABOPIA", "COCAINSCRNUR", "LABBENZ", "AMPHETMU", "THCU", "LABBARB"   Pain Inventory Average Pain 8 Pain Right Now 0 My pain is intermittent, sharp, and stabbing  In the last 24 hours, has pain interfered with the following? General activity 7 Relation with others 7 Enjoyment of life 8 What TIME of day is your pain at its worst? morning , daytime, evening, night, and varies Sleep (in general) Poor  Pain is worse with: walking Pain improves with: rest and medication Relief from Meds: 8  how many minutes can you walk? Maybe 15-20 minutes ability to climb steps?  yes do you drive?  yes  employed # of hrs/week Location manager full-time on [employer]  I need assistance with the following:  household duties Do you have any goals in this area?  yes  trouble walking  Any changes since last visit?  yes x-rays at [facility]   Any changes since last visit?  no    No family history on file. Social History   Socioeconomic History   Marital status: Married    Spouse name: Not on file   Number of children: Not on file   Years of education: Not on file   Highest education level: Not on file  Occupational History   Not on file  Tobacco Use   Smoking status: Never   Smokeless tobacco: Never   Substance and Sexual Activity   Alcohol use: Never   Drug use: Never   Sexual activity: Not on file  Other Topics Concern   Not on file  Social History Narrative   Not on file   Social Determinants of Health   Financial Resource Strain: Not on file  Food Insecurity: Not on file  Transportation Needs: Not on file  Physical Activity: Not on file  Stress: Not on file  Social Connections: Not on file   No past surgical history on file. No past medical history on file. There were no vitals taken for this visit.  Opioid Risk Score:   Fall Risk Score:  `1  Depression screen PHQ 2/9      No data to display          Review of Systems  Musculoskeletal:  Positive for gait problem.       Left foot pain & injury   All other systems reviewed and are negative.      Objective:   Physical Exam   PE: Constitution: Appropriate appearance for age. No apparent distress  Resp: No respiratory distress. No accessory muscle usage. on RA and CTAB Cardio: Well perfused appearance. No peripheral edema. Abdomen: Nondistended. Nontender.   Psych: Appropriate mood and affect. Neuro: AAOx4. No apparent cognitive deficits   Neurologic Exam:   DTRs: Reflexes were 2+ in bilateral achilles, patella, biceps, BR and triceps. Babinsky: flexor responses b/l.   Hoffmans: negative b/l Sensory exam: revealed normal sensation in all dermatomal regions in bilateral upper extremities, bilateral lower extremities, and with reduced sensation to light touch in just over left foot lateral surgical scar Motor exam: strength 5/5 throughout BL UE and Les; L ankle ROM slightly reduced and strength testing not performed due to current restrictions Coordination: Fine motor coordination was normal.   Gait: +antalgic gait with L CAM boot; overall stable but reduced stride length and R lean.        Assessment & Plan:   Franklyn Cafaro is a 62 y.o. year old male  who  has no past medical history on file.   They  are presenting to PM&R clinic as a new patient for treatment of L lateral foot pain s/p ORIF and subsequent infection of 5th metatarsal .   Chronic pain syndrome Encounter for therapeutic drug monitoring Encounter for opiate analgesic use agreement Chronic foot pain, left Chronic bilateral low back pain without sciatica  Today, you signed a pain contract and performed a urine drug screen.  I will refill your Percocet 5 mg 1 tab daily as needed for 1 month, then have you follow-up with me in clinic. We discussed using percocet before prolonged standing activities like church instead of after.  If you need additional medication in the immediate postop period, your surgeon is welcome to direct this and I will not restrict you as a result of our pain contract.  We will follow back up on this in 1 month, but let me know if you have any questions.  Today, we got lab works to look at your kidney and liver function.    Depending on the results, we may increase your gabapentin to 400 mg 3 times daily.  I will message you through MyChart regarding this decision.  We also discussed transitioning to Lyrica, but will defer at this time since you are doing so well with gabapentin.  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  Abnormality of gait and mobility Please follow all instructions by your surgeon for weightbearing restrictions both pre and postop.   We will hold off on further referrals for physical therapy, possible Qutenza patch, and possible peripheral nerve stimulator until after your operation on 11-20.  Other orders -     oxyCODONE-Acetaminophen; Take 1 tablet by mouth daily as needed for severe pain (pain score 7-10) (Try to take 20-30 minutes prior to  exacerbating activities).  Dispense: 20 tablet; Refill: 0  Addendum: Lab work showed normal GFR, significant for elevated ALP which is likely secondary to ongoing ankle issues and severely elevated blood glucose of 269.  No documented history of diabetes per the chart, message to patient and ordered hemoglobin A1c, and and forwarding information to PCP for follow-up.

## 2023-02-10 ENCOUNTER — Encounter: Payer: 59 | Attending: Physical Medicine and Rehabilitation | Admitting: Physical Medicine and Rehabilitation

## 2023-02-10 ENCOUNTER — Other Ambulatory Visit: Payer: Self-pay | Admitting: Physical Medicine and Rehabilitation

## 2023-02-10 ENCOUNTER — Encounter: Payer: Self-pay | Admitting: Physical Medicine and Rehabilitation

## 2023-02-10 ENCOUNTER — Telehealth: Payer: Self-pay | Admitting: Podiatry

## 2023-02-10 VITALS — BP 139/89 | HR 61 | Ht 66.0 in | Wt 186.0 lb

## 2023-02-10 DIAGNOSIS — M79672 Pain in left foot: Secondary | ICD-10-CM

## 2023-02-10 DIAGNOSIS — Z5181 Encounter for therapeutic drug level monitoring: Secondary | ICD-10-CM

## 2023-02-10 DIAGNOSIS — G8929 Other chronic pain: Secondary | ICD-10-CM

## 2023-02-10 DIAGNOSIS — Z029 Encounter for administrative examinations, unspecified: Secondary | ICD-10-CM

## 2023-02-10 DIAGNOSIS — M869 Osteomyelitis, unspecified: Secondary | ICD-10-CM | POA: Diagnosis not present

## 2023-02-10 DIAGNOSIS — G894 Chronic pain syndrome: Secondary | ICD-10-CM

## 2023-02-10 DIAGNOSIS — R29898 Other symptoms and signs involving the musculoskeletal system: Secondary | ICD-10-CM

## 2023-02-10 DIAGNOSIS — R739 Hyperglycemia, unspecified: Secondary | ICD-10-CM

## 2023-02-10 DIAGNOSIS — M545 Low back pain, unspecified: Secondary | ICD-10-CM

## 2023-02-10 DIAGNOSIS — R269 Unspecified abnormalities of gait and mobility: Secondary | ICD-10-CM

## 2023-02-10 DIAGNOSIS — T8459XA Infection and inflammatory reaction due to other internal joint prosthesis, initial encounter: Secondary | ICD-10-CM | POA: Diagnosis not present

## 2023-02-10 MED ORDER — OXYCODONE-ACETAMINOPHEN 5-325 MG PO TABS: 1.0000 | ORAL_TABLET | Freq: Every day | ORAL | 0 refills | Status: DC | PRN

## 2023-02-10 NOTE — Patient Instructions (Addendum)
Today, you signed a pain contract and performed a urine drug screen.  I will refill your Percocet 5 mg 1 tab daily as needed for 1 month, then have you follow-up with me in clinic.  We will hold off on further referrals for physical therapy, possible Qutenza patch, and possible peripheral nerve stimulator until after your operation on 11-20.  Please follow all instructions by your surgeon for weightbearing restrictions both pre and postop.  If you need additional medication in the immediate postop period, your surgeon is welcome to direct this and I will not restrict you as a result of her pain contract.  We will follow back up on this in 1 month, but let me know if you have any questions.  Today, we got lab works to look at your kidney and liver function.  Depending on the results, we may increase your gabapentin to 400 mg 3 times daily.  I will message you through MyChart regarding this decision.  We also discussed transitioning to Lyrica, but will defer at this time since you are doing so well with gabapentin.  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

## 2023-02-10 NOTE — Telephone Encounter (Signed)
Patient called stated he needs the doctor to send out an ok for him to get paid from his job. 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 He was informed that he was not approved for the pay to be extended to 39 weeks total. He states he needs additional information from Dr. Annamary Rummage with detailed information of diagnosis/ treatment plan etc.. Dawn with [employer] [phone]. Patient wrote a letter to Dr. Annamary Rummage explaining all of this. I put it in his box.

## 2023-02-10 NOTE — Progress Notes (Signed)
Sure

## 2023-02-11 LAB — CMP14+EGFR
ALT: 44 [IU]/L (ref 0–44)
AST: 24 [IU]/L (ref 0–40)
Albumin: 4.8 g/dL (ref 3.9–4.9)
Alkaline Phosphatase: 158 [IU]/L — ABNORMAL HIGH (ref 44–121)
BUN/Creatinine Ratio: 14 (ref 10–24)
BUN: 15 mg/dL (ref 8–27)
Bilirubin Total: 1 mg/dL (ref 0.0–1.2)
CO2: 26 mmol/L (ref 20–29)
Calcium: 10 mg/dL (ref 8.6–10.2)
Chloride: 100 mmol/L (ref 96–106)
Creatinine, Ser: 1.09 mg/dL (ref 0.76–1.27)
Globulin, Total: 2.9 g/dL (ref 1.5–4.5)
Glucose: 269 mg/dL — ABNORMAL HIGH (ref 70–99)
Potassium: 4.3 mmol/L (ref 3.5–5.2)
Sodium: 142 mmol/L (ref 134–144)
Total Protein: 7.7 g/dL (ref 6.0–8.5)
eGFR: 77 mL/min/{1.73_m2} (ref >59)

## 2023-02-12 ENCOUNTER — Encounter: Payer: Self-pay | Admitting: Physical Medicine and Rehabilitation

## 2023-02-12 ENCOUNTER — Encounter: Payer: Self-pay | Admitting: Podiatry

## 2023-02-12 MED ORDER — GABAPENTIN 400 MG PO CAPS: 400.0000 mg | ORAL_CAPSULE | Freq: Three times a day (TID) | ORAL | 5 refills | Status: DC

## 2023-02-12 NOTE — Addendum Note (Signed)
Addended by: Elijah Birk on: 02/12/2023 04:46 PM   Modules accepted: Orders

## 2023-02-13 ENCOUNTER — Other Ambulatory Visit: Payer: Self-pay

## 2023-02-13 ENCOUNTER — Emergency Department (HOSPITAL_COMMUNITY): Payer: 59

## 2023-02-13 ENCOUNTER — Encounter (HOSPITAL_COMMUNITY): Payer: Self-pay

## 2023-02-13 ENCOUNTER — Inpatient Hospital Stay (HOSPITAL_COMMUNITY)
Admission: EM | Admit: 2023-02-13 | Discharge: 2023-02-24 | DRG: 464 | Disposition: A | Discharge status: Home Health | Payer: 59 | Attending: Family Medicine | Admitting: Family Medicine

## 2023-02-13 DIAGNOSIS — G8929 Other chronic pain: Secondary | ICD-10-CM | POA: Diagnosis present

## 2023-02-13 DIAGNOSIS — L089 Local infection of the skin and subcutaneous tissue, unspecified: Secondary | ICD-10-CM | POA: Diagnosis present

## 2023-02-13 DIAGNOSIS — Z79899 Other long term (current) drug therapy: Secondary | ICD-10-CM

## 2023-02-13 DIAGNOSIS — M869 Osteomyelitis, unspecified: Secondary | ICD-10-CM | POA: Diagnosis present

## 2023-02-13 DIAGNOSIS — T847XXA Infection and inflammatory reaction due to other internal orthopedic prosthetic devices, implants and grafts, initial encounter: Secondary | ICD-10-CM | POA: Diagnosis not present

## 2023-02-13 DIAGNOSIS — I2510 Atherosclerotic heart disease of native coronary artery without angina pectoris: Secondary | ICD-10-CM | POA: Diagnosis present

## 2023-02-13 DIAGNOSIS — M19071 Primary osteoarthritis, right ankle and foot: Secondary | ICD-10-CM | POA: Diagnosis present

## 2023-02-13 DIAGNOSIS — Z885 Allergy status to narcotic agent status: Secondary | ICD-10-CM

## 2023-02-13 DIAGNOSIS — T847XXD Infection and inflammatory reaction due to other internal orthopedic prosthetic devices, implants and grafts, subsequent encounter: Secondary | ICD-10-CM | POA: Diagnosis not present

## 2023-02-13 DIAGNOSIS — E1169 Type 2 diabetes mellitus with other specified complication: Secondary | ICD-10-CM | POA: Diagnosis present

## 2023-02-13 DIAGNOSIS — H538 Other visual disturbances: Secondary | ICD-10-CM | POA: Diagnosis present

## 2023-02-13 DIAGNOSIS — I10 Essential (primary) hypertension: Secondary | ICD-10-CM | POA: Diagnosis present

## 2023-02-13 DIAGNOSIS — Z833 Family history of diabetes mellitus: Secondary | ICD-10-CM

## 2023-02-13 DIAGNOSIS — K219 Gastro-esophageal reflux disease without esophagitis: Secondary | ICD-10-CM | POA: Diagnosis present

## 2023-02-13 DIAGNOSIS — E1165 Type 2 diabetes mellitus with hyperglycemia: Secondary | ICD-10-CM | POA: Diagnosis present

## 2023-02-13 DIAGNOSIS — R3911 Hesitancy of micturition: Secondary | ICD-10-CM | POA: Diagnosis present

## 2023-02-13 DIAGNOSIS — Y792 Prosthetic and other implants, materials and accessory orthopedic devices associated with adverse incidents: Secondary | ICD-10-CM | POA: Diagnosis present

## 2023-02-13 DIAGNOSIS — M86172 Other acute osteomyelitis, left ankle and foot: Secondary | ICD-10-CM | POA: Diagnosis present

## 2023-02-13 DIAGNOSIS — M86672 Other chronic osteomyelitis, left ankle and foot: Secondary | ICD-10-CM | POA: Diagnosis present

## 2023-02-13 DIAGNOSIS — E781 Pure hyperglyceridemia: Secondary | ICD-10-CM | POA: Diagnosis present

## 2023-02-13 DIAGNOSIS — E559 Vitamin D deficiency, unspecified: Secondary | ICD-10-CM | POA: Diagnosis present

## 2023-02-13 DIAGNOSIS — K5900 Constipation, unspecified: Secondary | ICD-10-CM | POA: Diagnosis present

## 2023-02-13 DIAGNOSIS — R001 Bradycardia, unspecified: Secondary | ICD-10-CM | POA: Diagnosis present

## 2023-02-13 DIAGNOSIS — E119 Type 2 diabetes mellitus without complications: Principal | ICD-10-CM

## 2023-02-13 DIAGNOSIS — E11628 Type 2 diabetes mellitus with other skin complications: Secondary | ICD-10-CM | POA: Diagnosis not present

## 2023-02-13 DIAGNOSIS — Z791 Long term (current) use of non-steroidal anti-inflammatories (NSAID): Secondary | ICD-10-CM | POA: Diagnosis not present

## 2023-02-13 DIAGNOSIS — Z8619 Personal history of other infectious and parasitic diseases: Secondary | ICD-10-CM | POA: Diagnosis not present

## 2023-02-13 DIAGNOSIS — T8459XA Infection and inflammatory reaction due to other internal joint prosthesis, initial encounter: Principal | ICD-10-CM | POA: Diagnosis present

## 2023-02-13 DIAGNOSIS — M9689 Other intraoperative and postprocedural complications and disorders of the musculoskeletal system: Secondary | ICD-10-CM | POA: Diagnosis not present

## 2023-02-13 DIAGNOSIS — M5440 Lumbago with sciatica, unspecified side: Secondary | ICD-10-CM | POA: Diagnosis present

## 2023-02-13 DIAGNOSIS — T847XXS Infection and inflammatory reaction due to other internal orthopedic prosthetic devices, implants and grafts, sequela: Secondary | ICD-10-CM | POA: Diagnosis not present

## 2023-02-13 DIAGNOSIS — M86472 Chronic osteomyelitis with draining sinus, left ankle and foot: Secondary | ICD-10-CM | POA: Diagnosis not present

## 2023-02-13 DIAGNOSIS — Z794 Long term (current) use of insulin: Secondary | ICD-10-CM | POA: Diagnosis not present

## 2023-02-13 HISTORY — DX: Gastro-esophageal reflux disease without esophagitis: K21.9

## 2023-02-13 HISTORY — DX: Type 2 diabetes mellitus without complications: E11.9

## 2023-02-13 LAB — CBC WITH DIFFERENTIAL/PLATELET
Abs Immature Granulocytes: 0.02 10*3/uL (ref 0.00–0.07)
Basophils Absolute: 0.1 10*3/uL (ref 0.0–0.1)
Basophils Relative: 1 %
Eosinophils Absolute: 0.3 10*3/uL (ref 0.0–0.5)
Eosinophils Relative: 4 %
HCT: 43.3 % (ref 39.0–52.0)
Hemoglobin: 15.7 g/dL (ref 13.0–17.0)
Immature Granulocytes: 0 %
Lymphocytes Relative: 34 %
Lymphs Abs: 2.4 10*3/uL (ref 0.7–4.0)
MCH: 31.3 pg (ref 26.0–34.0)
MCHC: 36.3 g/dL — ABNORMAL HIGH (ref 30.0–36.0)
MCV: 86.4 fL (ref 80.0–100.0)
Monocytes Absolute: 0.5 10*3/uL (ref 0.1–1.0)
Monocytes Relative: 6 %
Neutro Abs: 3.9 10*3/uL (ref 1.7–7.7)
Neutrophils Relative %: 55 %
Platelets: 246 10*3/uL (ref 150–400)
RBC: 5.01 MIL/uL (ref 4.22–5.81)
RDW: 12.8 % (ref 11.5–15.5)
WBC: 7.2 10*3/uL (ref 4.0–10.5)
nRBC: 0 % (ref 0.0–0.2)

## 2023-02-13 LAB — COMPREHENSIVE METABOLIC PANEL
ALT: 38 U/L (ref 0–44)
AST: 24 U/L (ref 15–41)
Albumin: 4 g/dL (ref 3.5–5.0)
Alkaline Phosphatase: 116 U/L (ref 38–126)
Anion gap: 10 (ref 5–15)
BUN: 17 mg/dL (ref 8–23)
CO2: 25 mmol/L (ref 22–32)
Calcium: 9.6 mg/dL (ref 8.9–10.3)
Chloride: 100 mmol/L (ref 98–111)
Creatinine, Ser: 1.05 mg/dL (ref 0.61–1.24)
GFR, Estimated: >60 mL/min (ref >60)
Glucose, Bld: 359 mg/dL — ABNORMAL HIGH (ref 70–99)
Potassium: 3.8 mmol/L (ref 3.5–5.1)
Sodium: 135 mmol/L (ref 135–145)
Total Bilirubin: 1.3 mg/dL — ABNORMAL HIGH (ref <1.2)
Total Protein: 7.4 g/dL (ref 6.5–8.1)

## 2023-02-13 LAB — GLUCOSE, CAPILLARY: Glucose-Capillary: 238 mg/dL — ABNORMAL HIGH (ref 70–99)

## 2023-02-13 LAB — I-STAT CG4 LACTIC ACID, ED: Lactic Acid, Venous: 1.5 mmol/L (ref 0.5–1.9)

## 2023-02-13 MED ORDER — SODIUM CHLORIDE 0.9 % IV BOLUS
1000.0000 mL | Freq: Once | INTRAVENOUS | Status: AC
Start: 2023-02-13 — End: 2023-02-13
  Administered 2023-02-13: 1000 mL via INTRAVENOUS

## 2023-02-13 MED ORDER — ACETAMINOPHEN 325 MG PO TABS
650.0000 mg | ORAL_TABLET | Freq: Four times a day (QID) | ORAL | Status: DC | PRN
  Administered 2023-02-14 – 2023-02-15 (×3): 650 mg via ORAL
  Filled 2023-02-13 (×2): qty 2

## 2023-02-13 MED ORDER — INSULIN ASPART 100 UNIT/ML IJ SOLN
0.0000 [IU] | Freq: Three times a day (TID) | INTRAMUSCULAR | Status: DC
  Administered 2023-02-14: 2 [IU] via SUBCUTANEOUS
  Administered 2023-02-14: 3 [IU] via SUBCUTANEOUS
  Administered 2023-02-14 – 2023-02-16 (×3): 1 [IU] via SUBCUTANEOUS
  Administered 2023-02-16: 2 [IU] via SUBCUTANEOUS

## 2023-02-13 MED ORDER — HYDROCHLOROTHIAZIDE 12.5 MG PO TABS
12.5000 mg | ORAL_TABLET | Freq: Every day | ORAL | Status: DC
  Administered 2023-02-14 – 2023-02-24 (×11): 12.5 mg via ORAL
  Filled 2023-02-13 (×12): qty 1

## 2023-02-13 MED ORDER — GABAPENTIN 400 MG PO CAPS
400.0000 mg | ORAL_CAPSULE | Freq: Three times a day (TID) | ORAL | Status: DC
  Administered 2023-02-13 – 2023-02-24 (×31): 400 mg via ORAL
  Filled 2023-02-13 (×31): qty 1

## 2023-02-13 MED ORDER — ENOXAPARIN SODIUM 40 MG/0.4ML IJ SOSY
40.0000 mg | PREFILLED_SYRINGE | INTRAMUSCULAR | Status: DC
  Administered 2023-02-13: 40 mg via SUBCUTANEOUS
  Filled 2023-02-13: qty 0.4

## 2023-02-13 MED ORDER — PANTOPRAZOLE SODIUM 40 MG PO TBEC
80.0000 mg | DELAYED_RELEASE_TABLET | Freq: Every day | ORAL | Status: DC
  Administered 2023-02-14 – 2023-02-24 (×10): 80 mg via ORAL
  Filled 2023-02-13 (×9): qty 2
  Filled 2023-02-13: qty 4

## 2023-02-13 NOTE — Hospital Course (Addendum)
Tyler Rangel is a 62 y.o.male with a history of GERD, HTN who was admitted to the [facility] Medicine Teaching Service at [facility] for chronic osteomyelitis. His hospital course is detailed below:  Chronic osteomyelitis to L foot Patient presented with ongoing left foot pain in the setting of chronic osteomyelitis with prior injuries, procedures, and antibiotics. Did not meet sepsis/SIRS criteria at admission. Hardware removal and bone biopsies were performed on 11/10.  Repeat debridement, resection of base of fifth metatarsal on 11/12.  Repeat irrigation and debridement of left foot with tendon transfer on 11/15.  Staph caprae, Staph epidermidis, and GNR found on surgical culture. Per ID, patient was started on Cefazolin, transitioned to daptomycin and cefepime, discharged on daptomycin alone via PICC line. Patient pain well controlled throughout admission. Remains non-weight bearing with boot in place at discharge.   New onset type 2 diabetes Random glucose greater than 300 and A1c of 9.1. previously undiagnosed DM may have contributed repeated infections and chronic osteomyelitis. Patient received SSI during admission with largely stable BG in mid 100s. Patient was discharged on regimen of metformin 500 BID.   Hypertriglyceridemia Lipid panel with triglycerides 375, total 152, HDL 27, LDL 50. Calculated ASCVD risk of 27%.  Patient was started on Lipitor 40mg  during admission.   Urinary hesitancy  Reported months of difficulty with urination, noting difficulty emptying with intermittent segments of urination. No dysuria, hematuria. Postvoid residual volume 14 mL.  PSA WNL.  UA with 30 protein. Started on Flomax 0.4mg  daily. Symptoms improved by time of discharge.   Other chronic conditions were medically managed with home medications and formulary alternatives as necessary (HTN, GERD)  PCP Follow-up Recommendations: CBC, BMP at follow-up Diabetes education for new diagnosis Fu asymptomatic bradycardia  throughout admission  Fu podiatry and ID recs Patient concerned about [facility] of hemochromatosis - consider labs

## 2023-02-13 NOTE — Plan of Care (Signed)
FMTS Brief Progress Note  S: Went to see patient at bedside with Dr. Threasa Beards.  Patient overall doing well except sometimes has nerve pain that he describes in his left foot.  Otherwise doing okay.  O: BP (!) 161/89 (BP Location: Right Arm)   Pulse (!) 49   Temp 97.9 F (36.6 C) (Oral)   Resp 15   Ht 5\' 6"  (1.676 m)   Wt 82.1 kg   SpO2 100%   BMI 29.21 kg/m    General: NAD, awake, alert, responsive to questions Head: Normocephalic atraumatic Respiratory: chest rises symmetrically,  no increased work of breathing on RA Extremities: 2+ DP in left foot, mild tenderness to palpation along incision site, midl pain with movement of foot  A/P: Left foot infection Plan for bone biopsy Saturday morning-podiatry to see patient tomorrow. No antibiotics pending this procedure. - Orders reviewed. Labs for AM ordered, which was adjusted as needed.   Levin Erp, MD 02/13/2023, 10:06 PM PGY-3, [facility] Night Resident  Please page [phone] with questions.

## 2023-02-13 NOTE — Assessment & Plan Note (Addendum)
Presumed new onset diabetes, hyperglycemia greater than 300 with polyuria and polydipsia.  No evidence of DKA. -S/p 1 L fluid bolus -A1C -lipid panel -SSI

## 2023-02-13 NOTE — H&P (Addendum)
Hospital Admission History and Physical Service Pager: [phone]  Patient name: Tyler Rangel Medical record number: [id number] Date of Birth: 05/14/1960 Age: 62 y.o. Gender: male  Primary Care Provider: Jerrye Bushy, FNP Consultants: Podiatry Code Status: Full  Preferred Emergency Contact: Trudell,Kayly (Spouse) [phone] (Mobile)   Chief Complaint: Left foot pain  Assessment and Plan: Dermot Gremillion is a 62 y.o. male presenting with left foot pain due to chronic osteomyelitis s/p left foot fracture requiring surgery and hardware several months ago. XR shows no acute osseous involvement. Podiatry has scheduled pt for surgery on Saturday and will obtain culture before re-starting antibiotics. Pt has no leukocytosis, fever, or elevated lactate concerning for sepsis or systemic infection.   Per infectious disease note from 10/29: Pt had fall early August > left lateral foot fracture. Underwent ORIF with plate fixation 5/8. Given persistent pain, pt underwent revision with calcaneal bone graft harvest and bone marrow aspiration on 8/9. 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 Last podiatry note on 10/14 says plan is for removal of infected hardware with multiple bone biopsies and cultures from fifth metatarsal.  Assessment & Plan Left foot infection Podiatry scheduled pt for surgery on Saturday and will obtain culture and remove hardware. No MRI indicated at this time. XR shows no acute osseous involvement. Pt has no leukocytosis, fever, or elevated lactate concerning for sepsis or systemic infection.  -Admit to FMTS, attending Dr. Linwood Dibbles -hold IV antibiotics until culture obtained -NPO before midnight tomorrow -hold DVT prophylaxis prior to OR -Holding home Mobic prior to surgery -PT after surgery Diabetes mellitus, new onset (HCC) Presumed new onset  diabetes, hyperglycemia greater than 300 with polyuria and polydipsia.  No evidence of DKA. -S/p 1 L fluid bolus -A1C -lipid panel -SSI  FEN/GI: Regular diet VTE Prophylaxis: lovenox   Disposition: Med/Surg  History of Present Illness:  Tyler Rangel is a 62 y.o. male presenting with left foot pain from with concerns of osteomyelitis of left metatarsal complicated with hardware nonunion of the results of a fall with subsequent fracture. He states he was told he needed surgery, hardware removal and IV antibiotics. Pain 6/10. Podiatry had hard time scheduling pt for surgery and is why pt is admitted for surgery. Pt has not had improvement of foot. Pt can't walk or stand for long periods. Pt has burning shooting sensation from left foot. Has had cortisone shots on right foot for arthritis. No fever or SOB.   Pt also reports blurry vision from hyperglycemia and reports polyuria and polydipsia x 6 months. Pt needs new PCP.  In the ED, vital signs stable. Glucose 359. Pt given 2 L fluids.   Review Of Systems: Per HPI  Pertinent Past Medical History: Fracture of left foot twice and osteomyelitis Pericarditis around 7 years ago Acid reflux  Pertinent Past Surgical History: Left foot ORIF 5/8, revision 8/9   Remainder reviewed in history tab.  Pertinent Social History: Tobacco use: no Alcohol use: no  Other Substance use: no Lives with wife  Pertinent Family History: Mother and brother with diabetes Father had heart problems  Remainder reviewed in history tab.   Important Outpatient Medications: Esomeprazole 40 mg capsule every day Gabapentin 400 mg TID - will give bedtime dose Hydrochlorothiazide 12.5 mg every day Ibuprofen 600 mg Q8H PRN Claritin 10 mg every day Meloxicam 15 mg every day Percocet 5-325 mg every  day PRN  Vitamin D 09811 units every 7 days for 6 doses Viagra 25 mg every day PRN  Remainder reviewed in medication history.   Objective: BP (!) 151/81 (BP  Location: Right Arm)   Pulse 65   Temp 98.1 F (36.7 C) (Oral)   Resp 16   Ht 5\' 6"  (1.676 m)   Wt 82.1 kg   SpO2 99%   BMI 29.21 kg/m  Exam: General: alert, awake, oriented, laying comfortably in bed. NAD.  Heart: bradycardia, regular rhythm, no murmur. RP 2+ Abdomen: bowel sounds present, soft MSK: No redness, deformity, or swelling of left foot. No tenderness to light palpation. Warm to touch. Sensation intact.   Labs:  CBC BMET  Recent Labs  Lab 02/13/23 1454  WBC 7.2  HGB 15.7  HCT 43.3  PLT 246   Recent Labs  Lab 02/13/23 1454  NA 135  K 3.8  CL 100  CO2 25  BUN 17  CREATININE 1.05  GLUCOSE 359*  CALCIUM 9.6      Latest Reference Range & Units 02/13/23 14:59  Lactic Acid, Venous 0.5 - 1.9 mmol/L 1.5   Imaging Studies Performed: Left foot XR FINDINGS: There is improved density of the visualized osseous structures, when compared to the prior exam.   No acute fracture or dislocation.   No aggressive osseous lesion.   Mild hallux valgus deformity noted.   Note is again made of ORIF of left fifth tarsometatarsal. No periprosthetic fracture. Several additional ghost tracks noted in the fifth metatarsal. There is also a ghost tract in the calcaneus.   Mild degenerative changes of imaged joints.   Calcaneal spur noted along the Plantar aponeurosis attachment site.   No focal soft tissue swelling.   No radiopaque foreign bodies.   IMPRESSION: *No acute osseous abnormality of the left foot. *Other nonacute observations, as described above.   Ronney Lion, Medical Student 02/13/2023, 6:45 PM [facility]  FPTS Intern pager: [phone], text pages welcome Secure chat group [facility] Teaching Service   I was personally present and re-performed the exam and medical decision making and verified the service and findings are accurately documented in the student's note.  Erick Alley, DO 02/13/2023 8:13 PM

## 2023-02-13 NOTE — ED Provider Notes (Signed)
[place] EMERGENCY DEPARTMENT AT [facility] Provider Note   CSN: [id number] Arrival date & time: 02/13/23  1401     History  Chief Complaint  Patient presents with   Foot Pain    Tyler Rangel is a 62 y.o. male history of new onset hyperglycemia, diagnosed yesterday pain medicine, complex medical history follows with Dr. Annamary Rummage with podiatry with concerns of osteomyelitis of left metatarsal complicated with hardware nonunion of the results of a fall with subsequent fracture.  He states he was told he needed surgery, hardware removal and IV antibiotics.  He states he "outside" and the wound has healed well.  He continues to have pain he recently increase his gabapentin to 400 mg per his pain management specialist.  No numbness or weakness.  He is ambulatory on the foot.  No drainage.  No fevers.  States he spoke with, Raynelle Fanning" with podiatry who recommended he be seen here in the emergency department.  Triage note notes blurred vision however patient denies this with myself.  No numbness, weakness, slurred speech, diplopia.  HPI     Home Medications Prior to Admission medications   Medication Sig Start Date End Date Taking? Authorizing Provider  esomeprazole (NEXIUM) 40 MG capsule Take 40 mg by mouth daily. 07/17/20  Yes [provider]  gabapentin (NEURONTIN) 400 MG capsule Take 1 capsule (400 mg total) by mouth 3 (three) times daily. 02/12/23 02/12/24 Yes Elijah Birk C, DO  hydrochlorothiazide (MICROZIDE) 12.5 MG capsule Take 12.5 mg by mouth daily.   Yes [provider]  ibuprofen (ADVIL) 600 MG tablet Take 1 tablet (600 mg total) by mouth every 8 (eight) hours as needed. 11/15/22  Yes Standiford, Jenelle Mages, DPM  loratadine (CLARITIN) 10 MG tablet Take 10 mg by mouth daily.   Yes [provider]  meloxicam (MOBIC) 15 MG tablet TAKE ONE (1) TABLET BY MOUTH EVERY DAY 06/07/21  Yes Hyatt, Max T, DPM  oxyCODONE-acetaminophen (PERCOCET) 5-325 MG tablet  Take 1 tablet by mouth daily as needed for severe pain (pain score 7-10) (Try to take 20-30 minutes prior to exacerbating activities). 02/12/23 03/14/23 Yes Angelina Sheriff, DO  Vitamin D, Ergocalciferol, (DRISDOL) 1.25 MG (50000 UNIT) CAPS capsule Take 1 capsule (50,000 Units total) by mouth every 7 (seven) days for 6 doses. 01/23/23 02/28/23 Yes Standiford, Jenelle Mages, DPM  sildenafil (VIAGRA) 25 MG tablet Take 25-50 mg by mouth daily as needed. Patient not taking: Reported on 02/10/2023 09/09/22   [provider]      Allergies    Hydrocodone    Review of Systems   Review of Systems  Constitutional: Negative.   HENT: Negative.    Respiratory: Negative.    Cardiovascular: Negative.   Gastrointestinal: Negative.   Genitourinary: Negative.   Musculoskeletal:        Left foot pain  Skin:  Positive for wound.  Neurological: Negative.   All other systems reviewed and are negative.   Physical Exam Updated Vital Signs BP (!) 151/81 (BP Location: Right Arm)   Pulse 65   Temp 98.1 F (36.7 C) (Oral)   Resp 16   Ht 5\' 6"  (1.676 m)   Wt 82.1 kg   SpO2 99%   BMI 29.21 kg/m  Physical Exam Vitals and nursing note reviewed.  Constitutional:      General: He is not in acute distress.    Appearance: He is well-developed. He is not ill-appearing, toxic-appearing or diaphoretic.  HENT:  Head: Normocephalic and atraumatic.  Eyes:     Pupils: Pupils are equal, round, and reactive to light.  Cardiovascular:     Rate and Rhythm: Normal rate and regular rhythm.     Pulses: Normal pulses.          Radial pulses are 2+ on the right side and 2+ on the left side.       Dorsalis pedis pulses are 2+ on the right side and 2+ on the left side.     Heart sounds: Normal heart sounds.  Pulmonary:     Effort: Pulmonary effort is normal. No respiratory distress.  Abdominal:     General: There is no distension.     Palpations: Abdomen is soft.  Musculoskeletal:        General: Normal  range of motion.     Cervical back: Normal range of motion and neck supple.     Comments: Tenderness lateral aspect left foot.  He is able to plantarflex and dorsiflex without difficulty.  Wiggles toes.  2+ DP, PT pulses bilaterally.  Compartments soft.  Skin:    General: Skin is warm and dry.     Capillary Refill: Capillary refill takes less than 2 seconds.     Comments: No erythema, warmth, drainage.  Punctate 3 mm scabbed area without any obvious drainage  Neurological:     General: No focal deficit present.     Mental Status: He is alert and oriented to person, place, and time.     ED Results / Procedures / Treatments   Labs (all labs ordered are listed, but only abnormal results are displayed) Labs Reviewed  COMPREHENSIVE METABOLIC PANEL - Abnormal; Notable for the following components:      Result Value   Glucose, Bld 359 (*)    Total Bilirubin 1.3 (*)    All other components within normal limits  CBC WITH DIFFERENTIAL/PLATELET - Abnormal; Notable for the following components:   MCHC 36.3 (*)    All other components within normal limits  I-STAT CG4 LACTIC ACID, ED    EKG None  Radiology DG Foot Complete Left  Result Date: 02/13/2023 CLINICAL DATA:  Left foot pain. EXAM: LEFT FOOT - COMPLETE 3+ VIEW COMPARISON:  01/13/2023. FINDINGS: There is improved density of the visualized osseous structures, when compared to the prior exam. No acute fracture or dislocation. No aggressive osseous lesion. Mild hallux valgus deformity noted. Note is again made of ORIF of left fifth tarsometatarsal. No periprosthetic fracture. Several additional ghost tracks noted in the fifth metatarsal. There is also a ghost tract in the calcaneus. Mild degenerative changes of imaged joints. Calcaneal spur noted along the Plantar aponeurosis attachment site. No focal soft tissue swelling. No radiopaque foreign bodies. IMPRESSION: *No acute osseous abnormality of the left foot. *Other nonacute observations,  as described above. Electronically Signed   By: Jules Schick M.D.   On: 02/13/2023 15:17    Procedures Procedures    Medications Ordered in ED Medications  sodium chloride 0.9 % bolus 1,000 mL (1,000 mLs Intravenous New Bag/Given 02/13/23 1750)    ED Course/ Medical Decision Making/ A&P Clinical Course as of 02/13/23 1834  Thu Feb 13, 2023  1816 FM to admit [BH]    Clinical Course User Index [BH] Erlene Devita A, PA-C   62 year old new onset diabetes, chronic pain after complex medical history after fall with subsequent hardware placement, persistent infection, osteomyelitis followed with ID as well as podiatry here for evaluation of left foot pain.  Patient neurovascularly intact.  Wound externally does not appear obviously infected, no fluctuance, induration, drainage, wound dehiscence, erythema or warmth.  Note he is hyperglycemic here into the 300s.  States he was told yesterday by pain management that he had elevated blood sugar.  Does admit to polyuria and polydipsia.  Is not followed with his PCP for this and is therefore not on any diabetes medications at this time.  Plan on labs, imaging and follow-up with podiatry for recommendations  Labs and imaging personally viewed and interpreted:  CBC without leukocytosis Metabolic panel glucose 359, anion gap 10, CO2 25, low suspicion for DKA, HHS Lactic acid 1.5 X-ray no obvious fracture, osteomyelitis, dislocation  Given 1 L IV fluids for hyperglycemia.  Page sent to podiatry, Dr. Scarlette Ar  CONSULT with Dr. Annamary Rummage with Podiatry who rec NO Abx until after surgery-- Likely Sat morning. Does not need additional imaging in ED. He will see in consult tomorrow.  CONSULT with FM teaching is agreeable to eval patient for admission  Discussed plan with patient.  He does not want anything for pain at this time.  The patient appears reasonably stabilized for admission considering the current resources, flow, and capabilities  available in the ED at this time, and I doubt any other [facility] requiring further screening and/or treatment in the ED prior to admission.                                 Medical Decision Making Amount and/or Complexity of Data Reviewed External Data Reviewed: labs, radiology and notes. Labs: ordered. Decision-making details documented in ED Course. Radiology: ordered and independent interpretation performed. Decision-making details documented in ED Course.  Risk OTC drugs. Prescription drug management. Parenteral controlled substances. Decision regarding hospitalization. Diagnosis or treatment significantly limited by social determinants of health.          Final Clinical Impression(s) / ED Diagnoses Final diagnoses:  Left foot infection  Diabetes mellitus, new onset [facility])    Rx / DC Orders ED Discharge Orders     None         Corbet Hanley A, PA-C 02/13/23 1834    Alvira Monday, MD 02/14/23 1120

## 2023-02-13 NOTE — ED Notes (Signed)
ED TO INPATIENT HANDOFF REPORT  ED Nurse Name and Phone #: Grover Canavan [phone]  S Name/Age/Gender Tollie Eth 62 y.o. male Room/Bed: 042C/042C  Code Status   Code Status: Full Code  Home/SNF/Other Home Patient oriented to: self, place, time, and situation Is this baseline? Yes   Triage Complete: Triage complete  Chief Complaint Osteomyelitis [facility]) [M86.9]  Triage Note Patient c/o foot pain from bone infection that has had multiple surgeries and will require another. Patient VSS, but has 6/10 pain at this time. Patient also reports blurry vision from his sugar being high. Patient states podiatry told him he would need IV medication for it. A&Ox4, ambulatory for short distances.    Allergies Allergies  Allergen Reactions   Hydrocodone Nausea And Vomiting    Level of Care/Admitting Diagnosis ED Disposition     ED Disposition  Admit   Condition  --   Comment  Hospital Area: [facility] [[id number]]  Level of Care: Med-Surg [16]  May admit patient to [facility] or [facility] if equivalent level of care is available:: No  Covid Evaluation: Asymptomatic - no recent exposure (last 10 days) testing not required  Diagnosis: Osteomyelitis [facility]) [[id number]]  Admitting Physician: Erick Alley [[id number]]  Attending Physician: Caro Laroche [[id number]]  Certification:: I certify this patient will need inpatient services for at least 2 midnights  Expected Medical Readiness: 02/16/2023          B Medical/Surgery History History reviewed. No pertinent past medical history. Past Surgical History:  Procedure Laterality Date   FOOT SURGERY Left      A IV Location/Drains/Wounds Patient Lines/Drains/Airways Status     Active Line/Drains/Airways     Name Placement date Placement time Site Days   Peripheral IV 02/13/23 20 G Left Antecubital 02/13/23  1750  Antecubital  less than 1            Intake/Output Last 24 hours No intake or output data in the 24 hours  ending 02/13/23 1848  Labs/Imaging Results for orders placed or performed during the hospital encounter of 02/13/23 (from the past 48 hour(s))  Comprehensive metabolic panel     Status: Abnormal   Collection Time: 02/13/23  2:54 PM  Result Value Ref Range   Sodium 135 135 - 145 mmol/L   Potassium 3.8 3.5 - 5.1 mmol/L   Chloride 100 98 - 111 mmol/L   CO2 25 22 - 32 mmol/L   Glucose, Bld 359 (H) 70 - 99 mg/dL    Comment: Glucose reference range applies only to samples taken after fasting for at least 8 hours.   BUN 17 8 - 23 mg/dL   Creatinine, Ser 7.56 0.61 - 1.24 mg/dL   Calcium 9.6 8.9 - 43.3 mg/dL   Total Protein 7.4 6.5 - 8.1 g/dL   Albumin 4.0 3.5 - 5.0 g/dL   AST 24 15 - 41 U/L   ALT 38 0 - 44 U/L   Alkaline Phosphatase 116 38 - 126 U/L   Total Bilirubin 1.3 (H) <1.2 mg/dL   GFR, Estimated >29 >51 mL/min    Comment: (NOTE) Calculated using the CKD-EPI Creatinine Equation (2021)    Anion gap 10 5 - 15    Comment: Performed at [facility], [address], [place], [place] [address]  CBC with Differential     Status: Abnormal   Collection Time: 02/13/23  2:54 PM  Result Value Ref Range   WBC 7.2 4.0 - 10.5 K/uL   RBC  5.01 4.22 - 5.81 MIL/uL   Hemoglobin 15.7 13.0 - 17.0 g/dL   HCT 16.1 09.6 - 04.5 %   MCV 86.4 80.0 - 100.0 fL   MCH 31.3 26.0 - 34.0 pg   MCHC 36.3 (H) 30.0 - 36.0 g/dL   RDW 40.9 81.1 - 91.4 %   Platelets 246 150 - 400 K/uL   nRBC 0.0 0.0 - 0.2 %   Neutrophils Relative % 55 %   Neutro Abs 3.9 1.7 - 7.7 K/uL   Lymphocytes Relative 34 %   Lymphs Abs 2.4 0.7 - 4.0 K/uL   Monocytes Relative 6 %   Monocytes Absolute 0.5 0.1 - 1.0 K/uL   Eosinophils Relative 4 %   Eosinophils Absolute 0.3 0.0 - 0.5 K/uL   Basophils Relative 1 %   Basophils Absolute 0.1 0.0 - 0.1 K/uL   Immature Granulocytes 0 %   Abs Immature Granulocytes 0.02 0.00 - 0.07 K/uL    Comment: Performed at [facility], [address] [place], [place] [address]  I-Stat Lactic  Acid, ED     Status: None   Collection Time: 02/13/23  2:59 PM  Result Value Ref Range   Lactic Acid, Venous 1.5 0.5 - 1.9 mmol/L   DG Foot Complete Left  Result Date: 02/13/2023 CLINICAL DATA:  Left foot pain. EXAM: LEFT FOOT - COMPLETE 3+ VIEW COMPARISON:  01/13/2023. FINDINGS: There is improved density of the visualized osseous structures, when compared to the prior exam. No acute fracture or dislocation. No aggressive osseous lesion. Mild hallux valgus deformity noted. Note is again made of ORIF of left fifth tarsometatarsal. No periprosthetic fracture. Several additional ghost tracks noted in the fifth metatarsal. There is also a ghost tract in the calcaneus. Mild degenerative changes of imaged joints. Calcaneal spur noted along the Plantar aponeurosis attachment site. No focal soft tissue swelling. No radiopaque foreign bodies. IMPRESSION: *No acute osseous abnormality of the left foot. *Other nonacute observations, as described above. Electronically Signed   By: Jules Schick M.D.   On: 02/13/2023 15:17    Pending Labs Unresulted Labs (From admission, onward)     Start     Ordered   02/14/23 0500  Hemoglobin A1c  Tomorrow morning,   R        02/13/23 1846   02/14/23 0500  Basic metabolic panel  Tomorrow morning,   R        02/13/23 1846   02/14/23 0500  Lipid panel  Tomorrow morning,   R        02/13/23 1846   02/13/23 1844  HIV Antibody (routine testing w rflx)  (HIV Antibody (Routine testing w reflex) panel)  Once,   R        02/13/23 1846            Vitals/Pain Today's Vitals   02/13/23 1417 02/13/23 1431  BP: (!) 151/81   Pulse: 65   Resp: 16   Temp: 98.1 F (36.7 C)   TempSrc: Oral   SpO2: 99%   Weight:  82.1 kg  Height:  5\' 6"  (1.676 m)    Isolation Precautions No active isolations  Medications Medications  hydrochlorothiazide (MICROZIDE) capsule 12.5 mg (has no administration in time range)  pantoprazole (PROTONIX) EC tablet 80 mg (has no administration  in time range)  gabapentin (NEURONTIN) capsule 400 mg (has no administration in time range)  insulin aspart (novoLOG) injection 0-6 Units (has no administration in time range)  enoxaparin (LOVENOX) injection  40 mg (has no administration in time range)  sodium chloride 0.9 % bolus 1,000 mL (1,000 mLs Intravenous New Bag/Given 02/13/23 1750)    Mobility walks with person assist     Focused Assessments Musculoskeletal    R Recommendations: See Admitting Provider Note  Report given to:   Additional Notes:

## 2023-02-13 NOTE — ED Triage Notes (Signed)
Patient c/o foot pain from bone infection that has had multiple surgeries and will require another. Patient VSS, but has 6/10 pain at this time. Patient also reports blurry vision from his sugar being high. Patient states podiatry told him he would need IV medication for it. A&Ox4, ambulatory for short distances.

## 2023-02-14 ENCOUNTER — Encounter (HOSPITAL_COMMUNITY): Payer: Self-pay | Admitting: Student

## 2023-02-14 ENCOUNTER — Other Ambulatory Visit (HOSPITAL_COMMUNITY): Payer: Self-pay

## 2023-02-14 DIAGNOSIS — T847XXS Infection and inflammatory reaction due to other internal orthopedic prosthetic devices, implants and grafts, sequela: Secondary | ICD-10-CM | POA: Diagnosis not present

## 2023-02-14 DIAGNOSIS — M86672 Other chronic osteomyelitis, left ankle and foot: Secondary | ICD-10-CM | POA: Diagnosis not present

## 2023-02-14 DIAGNOSIS — E781 Pure hyperglyceridemia: Secondary | ICD-10-CM | POA: Insufficient documentation

## 2023-02-14 DIAGNOSIS — T847XXA Infection and inflammatory reaction due to other internal orthopedic prosthetic devices, implants and grafts, initial encounter: Secondary | ICD-10-CM

## 2023-02-14 DIAGNOSIS — Z794 Long term (current) use of insulin: Secondary | ICD-10-CM | POA: Diagnosis not present

## 2023-02-14 DIAGNOSIS — R001 Bradycardia, unspecified: Secondary | ICD-10-CM | POA: Insufficient documentation

## 2023-02-14 DIAGNOSIS — L089 Local infection of the skin and subcutaneous tissue, unspecified: Secondary | ICD-10-CM | POA: Diagnosis not present

## 2023-02-14 LAB — BASIC METABOLIC PANEL
Anion gap: 7 (ref 5–15)
BUN: 18 mg/dL (ref 8–23)
CO2: 25 mmol/L (ref 22–32)
Calcium: 9.1 mg/dL (ref 8.9–10.3)
Chloride: 105 mmol/L (ref 98–111)
Creatinine, Ser: 1.02 mg/dL (ref 0.61–1.24)
GFR, Estimated: >60 mL/min (ref >60)
Glucose, Bld: 285 mg/dL — ABNORMAL HIGH (ref 70–99)
Potassium: 3.5 mmol/L (ref 3.5–5.1)
Sodium: 137 mmol/L (ref 135–145)

## 2023-02-14 LAB — C-REACTIVE PROTEIN: CRP: 1.9 mg/dL — ABNORMAL HIGH (ref <1.0)

## 2023-02-14 LAB — LIPID PANEL
Cholesterol: 152 mg/dL (ref 0–200)
HDL: 27 mg/dL — ABNORMAL LOW (ref >40)
LDL Cholesterol: 50 mg/dL (ref 0–99)
Total CHOL/HDL Ratio: 5.6 {ratio}
Triglycerides: 375 mg/dL — ABNORMAL HIGH (ref <150)
VLDL: 75 mg/dL — ABNORMAL HIGH (ref 0–40)

## 2023-02-14 LAB — GLUCOSE, CAPILLARY
Glucose-Capillary: 258 mg/dL — ABNORMAL HIGH (ref 70–99)
Glucose-Capillary: 214 mg/dL — ABNORMAL HIGH (ref 70–99)
Glucose-Capillary: 193 mg/dL — ABNORMAL HIGH (ref 70–99)
Glucose-Capillary: 287 mg/dL — ABNORMAL HIGH (ref 70–99)

## 2023-02-14 LAB — SEDIMENTATION RATE: Sed Rate: 1 mm/h (ref 0–16)

## 2023-02-14 LAB — HEMOGLOBIN A1C
Hgb A1c MFr Bld: 9 % — ABNORMAL HIGH (ref 4.8–5.6)
Mean Plasma Glucose: 211.6 mg/dL

## 2023-02-14 LAB — TOXASSURE SELECT,+ANTIDEPR,UR

## 2023-02-14 LAB — HIV ANTIBODY (ROUTINE TESTING W REFLEX): HIV Screen 4th Generation wRfx: NONREACTIVE

## 2023-02-14 MED ORDER — LIVING WELL WITH DIABETES BOOK
Freq: Once | Status: AC
  Filled 2023-02-14: qty 1

## 2023-02-14 MED ORDER — CELECOXIB 200 MG PO CAPS: 200.0000 mg | ORAL_CAPSULE | ORAL | Status: AC

## 2023-02-14 MED ORDER — ACETAMINOPHEN 500 MG PO TABS: 1000.0000 mg | ORAL_TABLET | ORAL | Status: AC

## 2023-02-14 NOTE — Plan of Care (Signed)
  Problem: Nutritional: Goal: Maintenance of adequate nutrition will improve Outcome: Progressing   Problem: Activity: Goal: Risk for activity intolerance will decrease Outcome: Progressing   

## 2023-02-14 NOTE — Progress Notes (Signed)
Daily Progress Note Intern Pager: [phone]  Patient name: Tyler Rangel Medical record number: [id number] Date of birth: 08-03-1960 Age: 62 y.o. Gender: male  Primary Care Provider: Jerrye Bushy, FNP Consultants: podiatry Code Status: Full  Pt Overview and Major Events to Date:  11/7 - admitted  Assessment and Plan:  62yo male with PMHx HTN, GERD who presented with left foot pain in the setting of chronic osteomyelitis 2/2 left foot surgery several months ago. Podiatry will do surgery tomorrow.   Per infectious disease note from 10/29: Pt had fall early August > left lateral foot fracture. Underwent ORIF with plate fixation 5/8. Given persistent pain, pt underwent revision with calcaneal bone graft harvest and bone marrow aspiration on 8/9. 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 Last podiatry note on 10/14 says plan is for removal of infected hardware with multiple bone biopsies and cultures from fifth metatarsal.  Assessment & Plan Osteomyelitis (HCC) Chronic osteomyelitis and left foot after complications from surgery to fix left foot fracture in August.  Plan to remove infected hardware and take bone biopsies tomorrow.  Asymptomatic, no MRI indicated. -Podiatry on board, will follow their recommendations -Hold IV antibiotics until cultures are obtained -N.p.o. at midnight -Hold DVT prophylaxis prior to OR -Holding home Mobic -PT after surgery Diabetes mellitus, new onset (HCC) Presumed new onset diabetes, hyperglycemia greater than 300 with polyuria and polydipsia.  No evidence of DKA. A1C 9.0.  -very sensitive SSI -TOC consult for assistance with PCP, the one listed is no longer at the practice and he goes to urgent cares Hypertriglyceridemia Triglycerides 375, HDL 27. LDL 50   Chronic and Stable Problems:  HTN - continue home  hydrochlorothiazide 12.5mg  QD GERD - continue esomeprazole 40mg  QD   FEN/GI: Regular PPx: lovenox Dispo:Home pending clinical improvement   Subjective:  No acute events overnight.  Reports minimal foot pain that improves with gabapentin.  States he does need a PCP, typically goes to urgent cares.  Denies history of diabetes  Objective: Temp:  [97.9 F (36.6 C)-98.4 F (36.9 C)] 98.4 F (36.9 C) (11/08 0734) Pulse Rate:  [49-65] 51 (11/08 0734) Resp:  [15-16] 16 (11/08 0734) BP: (114-161)/(81-90) 114/90 (11/08 0734) SpO2:  [97 %-100 %] 98 % (11/08 0734) Weight:  [82.1 kg] 82.1 kg (11/07 1431) Physical Exam: General: Well-appearing, no acute distress Cardiovascular: Well-perfused Respiratory: No increased work of breathing on room air Extremities: 2+ DP PT pulses left lower extremity.  No redness, warmth, swelling to bilateral lower extremities.  See media tab for pictures of left foot  Laboratory: Most recent CBC Lab Results  Component Value Date   WBC 7.2 02/13/2023   HGB 15.7 02/13/2023   HCT 43.3 02/13/2023   MCV 86.4 02/13/2023   PLT 246 02/13/2023   Most recent BMP    Latest Ref Rng & Units 02/14/2023    6:15 AM  BMP  Glucose 70 - 99 mg/dL 564   BUN 8 - 23 mg/dL 18   Creatinine 3.32 - 1.24 mg/dL 9.51   Sodium 884 - 166 mmol/L 137   Potassium 3.5 - 5.1 mmol/L 3.5   Chloride 98 - 111 mmol/L 105   CO2 22 - 32 mmol/L 25   Calcium 8.9 - 10.3 mg/dL 9.1    A6T 9.0 TG 016, HDL 27  Imaging/Diagnostic Tests: XR Left Foot 02/13/23 IMPRESSION: *No acute osseous  abnormality of the left foot. *Other nonacute observations, as described above.  Kellin Fifer, DO 02/14/2023, 1:07 PM  PGY-1, [facility] FPTS Intern pager: [phone], text pages welcome Secure chat group [facility] Teaching Service

## 2023-02-14 NOTE — Plan of Care (Signed)
  Problem: Fluid Volume: Goal: Ability to maintain a balanced intake and output will improve Outcome: Progressing   Problem: Health Behavior/Discharge Planning: Goal: Ability to manage health-related needs will improve Outcome: Progressing   Problem: Nutrition: Goal: Adequate nutrition will be maintained Outcome: Progressing   Problem: Coping: Goal: Level of anxiety will decrease Outcome: Progressing

## 2023-02-14 NOTE — Anesthesia Preprocedure Evaluation (Signed)
Anesthesia Evaluation  Patient identified by MRN, date of birth, ID band Patient awake    Reviewed: Allergy & Precautions, NPO status , Patient's Chart, lab work & pertinent test results  Airway Mallampati: II  TM Distance: >3 FB Neck ROM: Full    Dental no notable dental hx.    Pulmonary neg pulmonary ROS   Pulmonary exam normal        Cardiovascular negative cardio ROS  Rhythm:Regular Rate:Normal     Neuro/Psych negative neurological ROS  negative psych ROS   GI/Hepatic negative GI ROS, Neg liver ROS,,,  Endo/Other  diabetes    Renal/GU   negative genitourinary   Musculoskeletal Left foot infected hardware   Abdominal Normal abdominal exam  (+)   Peds  Hematology Lab Results      Component                Value               Date                      WBC                      7.2                 02/13/2023                HGB                      15.7                02/13/2023                HCT                      43.3                02/13/2023                MCV                      86.4                02/13/2023                PLT                      246                 02/13/2023              Anesthesia Other Findings   Reproductive/Obstetrics                             Anesthesia Physical Anesthesia Plan  ASA: 3  Anesthesia Plan: MAC   Post-op Pain Management: Tylenol PO (pre-op)*, Celebrex PO (pre-op)* and Gabapentin PO (pre-op)*   Induction: Intravenous  PONV Risk Score and Plan: 2 and Ondansetron, Dexamethasone, Midazolam, Treatment may vary due to age or medical condition and Propofol infusion  Airway Management Planned: Simple Face Mask and Nasal Cannula  Additional Equipment: None  Intra-op Plan:   Post-operative Plan:   Informed Consent: I have reviewed the patients History and Physical, chart, labs and discussed the procedure including the risks, benefits and  alternatives for the proposed anesthesia with the  patient or authorized representative who has indicated his/her understanding and acceptance.     Dental advisory given  Plan Discussed with: CRNA  Anesthesia Plan Comments:        Anesthesia Quick Evaluation

## 2023-02-14 NOTE — Plan of Care (Signed)

## 2023-02-14 NOTE — Inpatient Diabetes Management (Signed)
Inpatient Diabetes Program Recommendations  AACE/ADA: New Consensus Statement on Inpatient Glycemic Control (2015)  Target Ranges:  Prepandial:   less than 140 mg/dL      Peak postprandial:   less than 180 mg/dL (1-2 hours)      Critically ill patients:  140 - 180 mg/dL   Lab Results  Component Value Date   GLUCAP 214 (H) 02/14/2023   HGBA1C 9.0 (H) 02/14/2023    Review of Glycemic Control  Latest Reference Range & Units 02/13/23 21:21 02/14/23 07:32 02/14/23 11:08  Glucose-Capillary 70 - 99 mg/dL 161 (H) 096 (H) 045 (H)  (H): Data is abnormally high  Diabetes history: New onset DM2 Outpatient Diabetes medications: None Current orders for Inpatient glycemic control: Novolog 0-6 units TID  Inpatient Diabetes Program Recommendations:    If glucose remains > 180 mg/dL,  please consider:  Semglee 10 units every day  Spoke with pt about new diagnosis. Discussed A1C results with him (9%-average BG of 212 mg/dL) and explained what an A1C is, basic pathophysiology of DM Type 2, basic home care, basic diabetes diet nutrition principles, importance of checking CBGs and maintaining good CBG control to prevent long-term and short-term complications. Reviewed signs and symptoms of hyperglycemia and hypoglycemia and how to treat hypoglycemia at home. Also reviewed blood sugar goals at home.  RNs to provide ongoing basic DM education at bedside with this patient. Have ordered educational booklet and it is at bedside.  We looked at page 27 together and discussed The Plate Method, importance of eliminating caloric beverages, checking CBGs, following up with PCP every 3-6 months and importance of exercise.    Will continue to follow while inpatient.  Thank you, Dulce Sellar, MSN, CDCES Diabetes Coordinator Inpatient Diabetes Program [phone] (team pager from 8a-5p)

## 2023-02-14 NOTE — Assessment & Plan Note (Signed)
Triglycerides 375, HDL 27. LDL 50

## 2023-02-14 NOTE — Assessment & Plan Note (Signed)
Podiatry scheduled pt for surgery on Saturday and will obtain culture and remove hardware. No MRI indicated at this time. XR shows no acute osseous involvement. Pt has no leukocytosis, fever, or elevated lactate concerning for sepsis or systemic infection.  -Admit to FMTS, attending Dr. Linwood Dibbles -hold IV antibiotics until culture obtained -NPO before midnight tomorrow -hold DVT prophylaxis prior to OR -Holding home Mobic prior to surgery -PT after surgery

## 2023-02-14 NOTE — Assessment & Plan Note (Signed)
Chronic osteomyelitis and left foot after complications from surgery to fix left foot fracture in August.  Plan to remove infected hardware and take bone biopsies tomorrow.  Asymptomatic, no MRI indicated. -Podiatry on board, will follow their recommendations -Hold IV antibiotics until cultures are obtained -N.p.o. at midnight -Hold DVT prophylaxis prior to OR -Holding home Mobic -PT after surgery

## 2023-02-14 NOTE — Assessment & Plan Note (Addendum)
Presumed new onset diabetes, hyperglycemia greater than 300 with polyuria and polydipsia.  No evidence of DKA. A1C 9.0.  -very sensitive SSI -TOC consult for assistance with PCP, the one listed is no longer at the practice and he goes to urgent cares

## 2023-02-14 NOTE — Consult Note (Signed)
PODIATRY CONSULTATION  NAME Tyler Rangel MRN [id number] DOB 02-21-1961 DOA 02/13/2023   Reason for consult:  Chief Complaint  Patient presents with   Foot Pain    Attending/Consulting physician: Ellwood Dense DO  History of present illness: 62 y.o. male who is well-known to me.  Please see office note from 10/14.  Prior history of fifth metatarsal base fracture that was initially treated conservatively with immobilization and nonweightbearing in cam boot.  He he was nearly completely healed when he reinjured the fifth metatarsal left foot sustaining a secondary Jones fracture.  Decision was then made in April of this year to proceed with ORIF of the fracture.  Unfortunately patient sustained nonunion following the initial procedure.  There was also some wound healing complication and delayed healing of the incision site.  Concern for infection was present.  Eventually due to severe pain patient was scheduled for hardware removal and repeat open reduction internal fixation with bone grafting as well as bone biopsy  to rule out osteomyelitis of the fifth metatarsal base/infected nonunion.  Based on the data that we collected from his secondary surgery there was no evidence of infection at that time.  Inflammatory markers were noted to be normal previously around the time of the second surgery.  However patient unfortunately went on to develop a second nonunion.  This was partially in part due to ambulating early against recommendations.  Patient was later tested and found to severe vitamin D deficiency.  Patient was thereafter placed on vitamin D supplementation unclear if he ever took it.  Patient is a non-smoker and reports he never has smoked   There was additional concern for wound healing after the second surgery completed in August and cannot rule out infection versus postsurgical changes once again.  Complicated surgical case due to the attachment of the peroneus brevis tendon at the fifth  metatarsal base which limits options and necessitates further surgical planning.  Patient has now undergone 2 surgeries with 2 nonunions and has concern for infection of the hardware as well as the fracture site.  Patient has been planned to have additional surgery to remove the hardware take bone biopsy multiple areas insert antibiotic beads and possibly graft.  There was difficulty getting the patient scheduled as an outpatient due to lack of availability in the operating room coming up at the hospital.  Therefore decision was made to direct admit the patient due to his ongoing pain as well as concern for osteomyelitis and possible hardware infection.  Patient has seen infectious disease as well as pain management as an outpatient in an attempt to manage this difficult case.  Infectious disease has recommended hardware removal with biopsy.  Of note mission patient was tested his A1c was found to be 9.  Previously no diagnosis of diabetes.  He has been dealing with multiple symptoms including polydipsia blurred vision and fatigue for several months.  Does not have a steady primary care doctor.   History reviewed. No pertinent past medical history.     Latest Ref Rng & Units 02/13/2023    2:54 PM 07/25/2020    5:00 PM  CBC  WBC 4.0 - 10.5 K/uL 7.2  7.5   Hemoglobin 13.0 - 17.0 g/dL 60.1  09.3   Hematocrit 39.0 - 52.0 % 43.3  45.9   Platelets 150 - 400 K/uL 246  250        Latest Ref Rng & Units 02/14/2023    6:15 AM 02/13/2023  2:54 PM 02/10/2023   11:02 AM  BMP  Glucose 70 - 99 mg/dL 161  096  045   BUN 8 - 23 mg/dL 18  17  15    Creatinine 0.61 - 1.24 mg/dL 4.09  8.11  9.14   BUN/Creat Ratio 10 - 24   14   Sodium 135 - 145 mmol/L 137  135  142   Potassium 3.5 - 5.1 mmol/L 3.5  3.8  4.3   Chloride 98 - 111 mmol/L 105  100  100   CO2 22 - 32 mmol/L 25  25  26    Calcium 8.9 - 10.3 mg/dL 9.1  9.6  78.2       Physical Exam: Lower Extremity Exam Vasc: R - PT palpable, DP palpable. Cap  refill < 3 sec to digits  L - PT palpable, DP palpable. Cap refill <3 sec to digits  Derm: R -no wound  L -incision is fully healed with small eschar at proximal aspect.  No open wound no erythema surrounding the area.  Very sensitive to touch at this area  MSK:  R -  No gross deformities. Compartments soft, non-tender, compressible  L -severe in the left lateral midfoot  Neuro: R - Gross sensation intact. Gross motor function intact   L - Gross sensation intact. Gross motor function intact    ASSESSMENT/PLAN OF CARE 62 y.o. 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 Strongly suspect that his newly diagnosed diabetes was playing a factor during the procedures that he previously had relating to poor wound healing as well as nonunion potentially.  This is in addition to vitamin D deficiency and ambulation against recommendation that ultimately led to the nonunions.  AF VSS A1c 9.0 ESR/CRP: Pending   - NPO p MN for OR tomorrow AM for left foot hardware removal, bone biopsy, abx beads, possible graft/ wound vac - Planned for staged procedure. Patient will return to OR later this admission for further surgery.  Further surgical plan pending results of bone biopsy.  Will need to consider resection of the fifth metatarsal base with peroneus brevis tendon transfer versus attempt at fracture repair - Holding ABX until after cultures obtained in OR. Recommend inpatient ID consult, he follows with ID outpatient.  - Anticoagulation: Hold pending OR - Wound care: None required at this time - WB status: WBAT to left foot pre op, will be NWB post op - Will continue to follow   Thank you for the consult.  Please contact me directly with any questions or concerns.           Corinna Gab, DPM [facility]    [address] [place], [place] [address]                Office [phone]  Fax [phone]

## 2023-02-14 NOTE — Assessment & Plan Note (Deleted)
Podiatry scheduled pt for surgery on Saturday and will obtain culture and remove hardware. No MRI indicated at this time. XR shows no acute osseous involvement. Pt has no leukocytosis, fever, or elevated lactate concerning for sepsis or systemic infection.  -Admit to FMTS, attending Dr. Linwood Dibbles -hold IV antibiotics until culture obtained -NPO before midnight tomorrow -hold DVT prophylaxis prior to OR -Holding home Mobic prior to surgery -PT after surgery

## 2023-02-14 NOTE — TOC Benefit Eligibility Note (Signed)
Patient Product/process development scientist completed.    The patient is insured through [employer] [facility]. Patient has [employer], may use a copay card, and/or apply for patient assistance if available.    Ran test claim for Lantus Pen and the current 30 day co-pay is $9.00.  Ran test claim for Novolog FlexPen and the current 30 day co-pay is $9.00.  This test claim was processed through [facility]- copay amounts may vary at other pharmacies due to pharmacy/plan contracts, or as the patient moves through the different stages of their insurance plan.     Roland Earl, CPHT Pharmacy Technician III Certified Patient Advocate [facility] Patient Advocate Team Direct Number: [phone]  Fax: [phone]

## 2023-02-15 ENCOUNTER — Encounter (HOSPITAL_COMMUNITY): Payer: Self-pay | Admitting: Student

## 2023-02-15 ENCOUNTER — Inpatient Hospital Stay (HOSPITAL_COMMUNITY): Payer: 59 | Admitting: Anesthesiology

## 2023-02-15 ENCOUNTER — Inpatient Hospital Stay (HOSPITAL_COMMUNITY): Payer: 59

## 2023-02-15 ENCOUNTER — Encounter (HOSPITAL_COMMUNITY)
Admission: EM | Disposition: A | Discharge status: Home Health | Payer: Self-pay | Source: Home / Self Care | Attending: Family Medicine

## 2023-02-15 ENCOUNTER — Other Ambulatory Visit: Payer: Self-pay

## 2023-02-15 DIAGNOSIS — T847XXS Infection and inflammatory reaction due to other internal orthopedic prosthetic devices, implants and grafts, sequela: Secondary | ICD-10-CM | POA: Diagnosis not present

## 2023-02-15 DIAGNOSIS — M86672 Other chronic osteomyelitis, left ankle and foot: Secondary | ICD-10-CM | POA: Diagnosis not present

## 2023-02-15 DIAGNOSIS — L089 Local infection of the skin and subcutaneous tissue, unspecified: Secondary | ICD-10-CM | POA: Diagnosis not present

## 2023-02-15 DIAGNOSIS — M869 Osteomyelitis, unspecified: Secondary | ICD-10-CM

## 2023-02-15 DIAGNOSIS — M9689 Other intraoperative and postprocedural complications and disorders of the musculoskeletal system: Secondary | ICD-10-CM

## 2023-02-15 DIAGNOSIS — E1169 Type 2 diabetes mellitus with other specified complication: Secondary | ICD-10-CM

## 2023-02-15 HISTORY — PX: HARDWARE REMOVAL: SHX979

## 2023-02-15 LAB — BASIC METABOLIC PANEL
Anion gap: 10 (ref 5–15)
BUN: 20 mg/dL (ref 8–23)
CO2: 24 mmol/L (ref 22–32)
Calcium: 8.7 mg/dL — ABNORMAL LOW (ref 8.9–10.3)
Chloride: 100 mmol/L (ref 98–111)
Creatinine, Ser: 1.05 mg/dL (ref 0.61–1.24)
GFR, Estimated: >60 mL/min (ref >60)
Glucose, Bld: 249 mg/dL — ABNORMAL HIGH (ref 70–99)
Potassium: 3.4 mmol/L — ABNORMAL LOW (ref 3.5–5.1)
Sodium: 134 mmol/L — ABNORMAL LOW (ref 135–145)

## 2023-02-15 LAB — GLUCOSE, CAPILLARY
Glucose-Capillary: 238 mg/dL — ABNORMAL HIGH (ref 70–99)
Glucose-Capillary: 180 mg/dL — ABNORMAL HIGH (ref 70–99)
Glucose-Capillary: 140 mg/dL — ABNORMAL HIGH (ref 70–99)
Glucose-Capillary: 191 mg/dL — ABNORMAL HIGH (ref 70–99)
Glucose-Capillary: 241 mg/dL — ABNORMAL HIGH (ref 70–99)

## 2023-02-15 LAB — CBC
HCT: 38.2 % — ABNORMAL LOW (ref 39.0–52.0)
Hemoglobin: 14.3 g/dL (ref 13.0–17.0)
MCH: 32.1 pg (ref 26.0–34.0)
MCHC: 37.4 g/dL — ABNORMAL HIGH (ref 30.0–36.0)
MCV: 85.8 fL (ref 80.0–100.0)
Platelets: 199 10*3/uL (ref 150–400)
RBC: 4.45 MIL/uL (ref 4.22–5.81)
RDW: 12.5 % (ref 11.5–15.5)
WBC: 6.8 10*3/uL (ref 4.0–10.5)
nRBC: 0 % (ref 0.0–0.2)

## 2023-02-15 LAB — SURGICAL PCR SCREEN
MRSA, PCR: NEGATIVE
Staphylococcus aureus: NEGATIVE

## 2023-02-15 SURGERY — REMOVAL, HARDWARE
Anesthesia: Monitor Anesthesia Care | Laterality: Left

## 2023-02-15 MED ORDER — PROPOFOL 500 MG/50ML IV EMUL
INTRAVENOUS | Status: DC | PRN
  Administered 2023-02-15: 20 mg via INTRAVENOUS
  Administered 2023-02-15: 50 mg via INTRAVENOUS
  Administered 2023-02-15: 100 ug/kg/min via INTRAVENOUS

## 2023-02-15 MED ORDER — FENTANYL CITRATE (PF) 100 MCG/2ML IJ SOLN
INTRAMUSCULAR | Status: AC
  Filled 2023-02-15: qty 2

## 2023-02-15 MED ORDER — ONDANSETRON HCL 4 MG/2ML IJ SOLN
INTRAMUSCULAR | Status: AC
  Filled 2023-02-15: qty 2

## 2023-02-15 MED ORDER — ATORVASTATIN CALCIUM 40 MG PO TABS
40.0000 mg | ORAL_TABLET | Freq: Every day | ORAL | Status: DC
  Administered 2023-02-15 – 2023-02-24 (×10): 40 mg via ORAL
  Filled 2023-02-15 (×10): qty 1

## 2023-02-15 MED ORDER — BUPIVACAINE HCL (PF) 0.5 % IJ SOLN
INTRAMUSCULAR | Status: AC
  Filled 2023-02-15: qty 10

## 2023-02-15 MED ORDER — LIDOCAINE 2% (20 MG/ML) 5 ML SYRINGE
INTRAMUSCULAR | Status: AC
  Filled 2023-02-15: qty 5

## 2023-02-15 MED ORDER — VANCOMYCIN HCL 500 MG IV SOLR
INTRAVENOUS | Status: AC
  Filled 2023-02-15: qty 10

## 2023-02-15 MED ORDER — PROPOFOL 1000 MG/100ML IV EMUL
INTRAVENOUS | Status: AC
  Filled 2023-02-15: qty 100

## 2023-02-15 MED ORDER — INSULIN ASPART 100 UNIT/ML IJ SOLN
0.0000 [IU] | INTRAMUSCULAR | Status: AC | PRN
  Administered 2023-02-15: 6 [IU] via SUBCUTANEOUS
  Administered 2023-02-15: 2 [IU] via SUBCUTANEOUS

## 2023-02-15 MED ORDER — ACETAMINOPHEN 500 MG PO TABS
ORAL_TABLET | ORAL | Status: AC
  Filled 2023-02-15: qty 2

## 2023-02-15 MED ORDER — ACETAMINOPHEN 500 MG PO TABS
1000.0000 mg | ORAL_TABLET | Freq: Four times a day (QID) | ORAL | Status: DC
  Administered 2023-02-15 – 2023-02-24 (×31): 1000 mg via ORAL
  Filled 2023-02-15 (×33): qty 2

## 2023-02-15 MED ORDER — OXYCODONE HCL 5 MG PO TABS
5.0000 mg | ORAL_TABLET | ORAL | Status: AC | PRN
  Administered 2023-02-15 – 2023-02-19 (×8): 5 mg via ORAL
  Filled 2023-02-15 (×8): qty 1

## 2023-02-15 MED ORDER — ACETAMINOPHEN 325 MG PO TABS
ORAL_TABLET | ORAL | Status: AC
  Filled 2023-02-15: qty 2

## 2023-02-15 MED ORDER — SODIUM CHLORIDE 0.9% FLUSH
10.0000 mL | Freq: Once | INTRAVENOUS | Status: AC
  Administered 2023-02-15: 10 mL via INTRAVENOUS

## 2023-02-15 MED ORDER — FENTANYL CITRATE (PF) 100 MCG/2ML IJ SOLN
25.0000 ug | INTRAMUSCULAR | Status: DC | PRN
  Administered 2023-02-15: 25 ug via INTRAVENOUS

## 2023-02-15 MED ORDER — AMISULPRIDE (ANTIEMETIC) 5 MG/2ML IV SOLN: 10.0000 mg | Freq: Once | INTRAVENOUS | Status: DC | PRN

## 2023-02-15 MED ORDER — PROPOFOL 10 MG/ML IV BOLUS
INTRAVENOUS | Status: AC
  Filled 2023-02-15: qty 20

## 2023-02-15 MED ORDER — CELECOXIB 200 MG PO CAPS
ORAL_CAPSULE | ORAL | Status: AC
  Administered 2023-02-15: 200 mg via ORAL
  Filled 2023-02-15: qty 1

## 2023-02-15 MED ORDER — GENTAMICIN SULFATE 40 MG/ML IJ SOLN
INTRAMUSCULAR | Status: AC
  Filled 2023-02-15: qty 4

## 2023-02-15 MED ORDER — MIDAZOLAM HCL 2 MG/2ML IJ SOLN
INTRAMUSCULAR | Status: AC
  Filled 2023-02-15: qty 2

## 2023-02-15 MED ORDER — MIDAZOLAM HCL 2 MG/2ML IJ SOLN
INTRAMUSCULAR | Status: DC | PRN
  Administered 2023-02-15: 2 mg via INTRAVENOUS

## 2023-02-15 MED ORDER — ONDANSETRON HCL 4 MG/2ML IJ SOLN
INTRAMUSCULAR | Status: DC | PRN
  Administered 2023-02-15: 4 mg via INTRAVENOUS

## 2023-02-15 MED ORDER — PHENYLEPHRINE 80 MCG/ML (10ML) SYRINGE FOR IV PUSH (FOR BLOOD PRESSURE SUPPORT)
PREFILLED_SYRINGE | INTRAVENOUS | Status: AC
  Filled 2023-02-15: qty 10

## 2023-02-15 MED ORDER — SODIUM CHLORIDE 0.9 % IR SOLN
Status: DC | PRN
  Administered 2023-02-15: 3000 mL

## 2023-02-15 MED ORDER — LACTATED RINGERS IV SOLN: INTRAVENOUS | Status: DC | PRN

## 2023-02-15 MED ORDER — DEXAMETHASONE SODIUM PHOSPHATE 10 MG/ML IJ SOLN
INTRAMUSCULAR | Status: AC
  Filled 2023-02-15: qty 1

## 2023-02-15 MED ORDER — ACETAMINOPHEN 500 MG PO TABS
ORAL_TABLET | ORAL | Status: AC
  Administered 2023-02-15: 1000 mg via ORAL
  Filled 2023-02-15: qty 2

## 2023-02-15 MED ORDER — CHLORHEXIDINE GLUCONATE 0.12 % MT SOLN
OROMUCOSAL | Status: AC
  Administered 2023-02-15: 15 mL via OROMUCOSAL
  Filled 2023-02-15: qty 15

## 2023-02-15 MED ORDER — ACETAMINOPHEN 325 MG PO TABS: 650.0000 mg | ORAL_TABLET | Freq: Four times a day (QID) | ORAL | Status: DC

## 2023-02-15 MED ORDER — LIDOCAINE HCL 1 % IJ SOLN
INTRAMUSCULAR | Status: AC
  Filled 2023-02-15: qty 20

## 2023-02-15 MED ORDER — LIDOCAINE HCL (PF) 1 % IJ SOLN
INTRAMUSCULAR | Status: DC | PRN
  Administered 2023-02-15: 20 mL via INTRAMUSCULAR

## 2023-02-15 MED ORDER — TOBRAMYCIN SULFATE 80 MG/2ML IJ SOLN
INTRAMUSCULAR | Status: AC
  Filled 2023-02-15: qty 4

## 2023-02-15 MED ORDER — GLYCOPYRROLATE PF 0.2 MG/ML IJ SOSY
PREFILLED_SYRINGE | INTRAMUSCULAR | Status: AC
  Filled 2023-02-15: qty 1

## 2023-02-15 MED ORDER — VANCOMYCIN HCL 1000 MG IV SOLR
INTRAVENOUS | Status: AC
  Filled 2023-02-15: qty 20

## 2023-02-15 MED ORDER — ORAL CARE MOUTH RINSE: 15.0000 mL | Freq: Once | OROMUCOSAL | Status: AC

## 2023-02-15 MED ORDER — CHLORHEXIDINE GLUCONATE 0.12 % MT SOLN: 15.0000 mL | Freq: Once | OROMUCOSAL | Status: AC

## 2023-02-15 SURGICAL SUPPLY — 58 items
APL PRP STRL LF DISP 70% ISPRP (MISCELLANEOUS) ×1
BAG COUNTER SPONGE SURGICOUNT (BAG) ×1 IMPLANT
BAG SPNG CNTER NS LX DISP (BAG) ×1
BLADE SAW SGTL 83.5X18.5 (BLADE) ×1 IMPLANT
BLADE SURG 15 STRL LF DISP TIS (BLADE) ×3 IMPLANT
BLADE SURG 15 STRL SS (BLADE) ×3
BNDG CMPR 5X4 KNIT ELC UNQ LF (GAUZE/BANDAGES/DRESSINGS) ×1
BNDG CMPR 5X6 CHSV STRCH STRL (GAUZE/BANDAGES/DRESSINGS)
BNDG CMPR 9X4 STRL LF SNTH (GAUZE/BANDAGES/DRESSINGS) ×1
BNDG COHESIVE 6X5 TAN ST LF (GAUZE/BANDAGES/DRESSINGS) IMPLANT
BNDG ELASTIC 4INX 5YD STR LF (GAUZE/BANDAGES/DRESSINGS) IMPLANT
BNDG ELASTIC 4X5.8 VLCR STR LF (GAUZE/BANDAGES/DRESSINGS) IMPLANT
BNDG ESMARK 4X9 LF (GAUZE/BANDAGES/DRESSINGS) ×1 IMPLANT
BNDG GAUZE DERMACEA FLUFF 4 (GAUZE/BANDAGES/DRESSINGS) ×2 IMPLANT
BNDG GZE DERMACEA 4 6PLY (GAUZE/BANDAGES/DRESSINGS) ×1
BOWL SMART MIX CTS (DISPOSABLE) IMPLANT
CHLORAPREP W/TINT 26 (MISCELLANEOUS) ×1 IMPLANT
COVER SURGICAL LIGHT HANDLE (MISCELLANEOUS) ×1 IMPLANT
CUFF TOURN SGL QUICK 18X4 (TOURNIQUET CUFF) ×1 IMPLANT
CUFF TOURN SGL QUICK 34 (TOURNIQUET CUFF) ×1
CUFF TRNQT CYL 34X4.125X (TOURNIQUET CUFF) ×1 IMPLANT
DRAPE OEC MINIVIEW 54X84 (DRAPES) ×1 IMPLANT
DRAPE U-SHAPE 47X51 STRL (DRAPES) ×1 IMPLANT
ELECT CAUTERY BLADE 6.4 (BLADE) ×1 IMPLANT
ELECT REM PT RETURN 9FT ADLT (ELECTROSURGICAL)
ELECTRODE REM PT RTRN 9FT ADLT (ELECTROSURGICAL) IMPLANT
GAUZE PAD ABD 8X10 STRL (GAUZE/BANDAGES/DRESSINGS) ×1 IMPLANT
GAUZE SPONGE 4X4 12PLY STRL (GAUZE/BANDAGES/DRESSINGS) ×1 IMPLANT
GAUZE XEROFORM 1X8 LF (GAUZE/BANDAGES/DRESSINGS) ×1 IMPLANT
GLOVE BIO SURGEON STRL SZ8 (GLOVE) ×1 IMPLANT
GLOVE BIOGEL PI IND STRL 8 (GLOVE) ×1 IMPLANT
GOWN STRL REUS W/ TWL LRG LVL3 (GOWN DISPOSABLE) ×2 IMPLANT
GOWN STRL REUS W/TWL LRG LVL3 (GOWN DISPOSABLE) ×2
HANDPIECE INTERPULSE COAX TIP (DISPOSABLE)
KIT BASIN OR (CUSTOM PROCEDURE TRAY) ×1 IMPLANT
KIT TURNOVER KIT B (KITS) ×1 IMPLANT
MANIFOLD NEPTUNE II (INSTRUMENTS) ×1 IMPLANT
NDL HYPO 25GX1X1/2 BEV (NEEDLE) ×1 IMPLANT
NEEDLE HYPO 25GX1X1/2 BEV (NEEDLE) ×1
NS IRRIG 1000ML POUR BTL (IV SOLUTION) ×1 IMPLANT
PACK ORTHO EXTREMITY (CUSTOM PROCEDURE TRAY) ×1 IMPLANT
PAD ARMBOARD 7.5X6 YLW CONV (MISCELLANEOUS) ×2 IMPLANT
PAD CAST 4YDX4 CTTN HI CHSV (CAST SUPPLIES) ×1 IMPLANT
PADDING CAST COTTON 4X4 STRL (CAST SUPPLIES) ×1
PADDING CAST COTTON 6X4 STRL (CAST SUPPLIES) ×1 IMPLANT
SET HNDPC FAN SPRY TIP SCT (DISPOSABLE) IMPLANT
SOL PREP POV-IOD 4OZ 10% (MISCELLANEOUS) ×2 IMPLANT
STAPLER VISISTAT (STAPLE) ×1 IMPLANT
STAPLER VISISTAT 35W (STAPLE) ×1 IMPLANT
STOCKINETTE IMPERVIOUS 9X36 MD (GAUZE/BANDAGES/DRESSINGS) ×1 IMPLANT
SUT PROLENE 3 0 PS 2 (SUTURE) ×1 IMPLANT
SWAB CULTURE LIQ STUART DBL (MISCELLANEOUS) ×1 IMPLANT
SWAB CULTURE LIQUID MINI MALE (MISCELLANEOUS) ×1 IMPLANT
SYR CONTROL 10ML LL (SYRINGE) ×1 IMPLANT
TOWEL GREEN STERILE (TOWEL DISPOSABLE) ×1 IMPLANT
TOWEL GREEN STERILE FF (TOWEL DISPOSABLE) ×1 IMPLANT
TUBE CONNECTING 12X1/4 (SUCTIONS) ×1 IMPLANT
YANKAUER SUCT BULB TIP NO VENT (SUCTIONS) ×1 IMPLANT

## 2023-02-15 NOTE — Op Note (Signed)
Full Operative Report  Date of Operation: 10:45 AM, 02/15/2023   Patient: Tyler Rangel - 62 y.o. 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 Removal of deep hardware, left foot 2. Multiple open bone biopsy 5th metatarsal base, left foot    Anesthesia: Monitor Anesthesia Care  No responsible provider has been recorded for the case.  Anesthesiologist: Atilano Median, DO CRNA: Gloris Ham, CRNA; Little Ishikawa, CRNA   Estimated Blood Loss: Minimal   Hemostasis: 1) Anatomical dissection, mechanical compression, electrocautery 2) Ankle tourniquet 250 mm Hg 30 min  Implants: * No implants in log *  Materials: Nylon suture  Injectables: 1) Pre-operatively: 20 cc of 50:50 mixture 1%lidocaine plain and 0.5% marcaine plain 2) Post-operatively: None   Specimens: Pathology: 5th metatarsal styloid process bone for pathology, 5th metatarsal nonunion bone for pathology  Microbiology: 5th metatarsal bone for culture, explanted hardware for culture.    Antibiotics: No antibiotics via IV or p.o. were given  preoperative this admission to obtain better culture data  Drains: None  Complications: Patient tolerated the procedure well without complication.   Operative findings: As below in detailed report  Indications for Procedure: Sayed Piwowar presents to Pilar Plate, DPM with a chief complaint of chronic pain, recurrent nonunion of the left foot, concern for infected nonunion or infected hardware. The patient has failed conservative treatments of various modalities. At this time the patient has elected to proceed with surgical correction. All alternatives, risks, and complications of the procedures were thoroughly explained to the patient. Patient exhibits appropriate understanding of all discussion points and informed consent was signed and obtained in the chart with  no guarantees to surgical outcome given or implied.  Description of Procedure: Patient was brought to the operating room. Patient transferred to OR table. A surgical timeout was performed and all members of the operating room, the procedure, and the surgical site were identified. anesthesia occurred as per anesthesia record. Local anesthetic as previously described was then injected about the operative field in a local infiltrative block.  The operative lower extremity as noted above was then prepped and draped in the usual sterile manner.  A tourniquet was applied to the ankle and inflated to 250 mm Hg. The following procedure then began.  Attention was directed to the left foot were the previous surgical incision was noted at the lateral aspect of the left midfoot. Using the previous incision, a #15 blade was used to incise through skin. This was deepened through subcutaneous tissues with sharp and blunt dissection and care was taken to protect all vital structures. Scar tissue was noted throughout. A combination of sharp and dull dissection was used to free periosteal and fibrotic tissues from the hardware. At this time, the hardware was well visualized and was removed without complication. The hardware was passed to the back table collected and sent for culture. Fluoroscopy was used to verify complete removal.   Next multiple deep open bone biopsy was completed of the 5th metatarsal base area. Rongeur was used to take a bone specimen for culture from the styloid process - sent to pathology. A separate bone and tissue specimen was taken with rongeur from the styloid process of the 5th met base for culture. Next, a sagittal saw was used to resect a circular piece of bone at the site of nonunion. This was passed to back table and sent for pathology.  The bone at the nonunion site as well as the styloid process appeared hard and viable, no obvious evidence of infection.  The area was then flushed with copious  amounts of sterile saline. Then using the suture materials previously described, the site was closed in anatomic layers and the skin was well approximated under minimal tension.  The surgical site was then dressed with xerofrom 4x4 kerlx and ace wrap. The patient tolerated both the procedure and anesthesia well with vital signs stable throughout. The patient was transferred in good condition and all vital signs stable  from the OR to recovery under the discretion of anesthesia.  Condition: Vital signs stable, neurovascular status unchanged from preoperative   Surgical plan:  No evidence of infected bone at the 5th met nonunion or styloid process, bone appeared healthy firm viable. Will await path and culture data. If negative for infection will consider repeat ORIF with external fixation device vs resection of 5th met base and peroneus brevis tendon transfer.   The patient will be NWB in a CAM boot to the operative limb until further instructed. The dressing is to remain clean, dry, and intact. Will continue to follow unless noted elsewhere.   Carlena Hurl, DPM [facility]

## 2023-02-15 NOTE — Progress Notes (Signed)
     Daily Progress Note Intern Pager: [phone]  Patient name: Tyler Rangel Medical record number: [id number] Date of birth: 07/26/1960 Age: 62 y.o. Gender: male  Primary Care Provider: Jerrye Bushy, FNP Consultants: Podiatry Code Status: Full  Pt Overview and Major Events to Date:  11/7-admitted 11/9-plan for bone biopsy/hardware removal of left foot  Assessment and Plan:  Tyler Rangel is a 62 year old male PMH of HTN, GERD, left foot pain in the setting of chronic osteomyelitis secondary to left foot surgery several months ago.  Podiatry planning on bone biopsy today.  Holding antibiotics pending surgical cultures being obtained. Assessment & Plan Osteomyelitis (HCC) Chronic osteomyelitis and left foot after complications from surgery to fix left foot fracture in August. Plan to remove infected hardware and take bone biopsies 11/9.  Asymptomatic, no MRI indicated. -Podiatry on board, will follow their recommendations -Hold IV antibiotics until cultures are obtained, consider starting after -N.p.o. pending surgery today -Hold DVT prophylaxis prior to OR, restart after -Holding home Mobic -PT after surgery -Gabapentin 400 TID -Tylenol 650 mg q6h PRN Diabetes mellitus, new onset (HCC) New onset diabetes, random glucose greater than 300 with polyuria and polydipsia.  A1C 9.0. CBGs in 200s yesterday. -increase to sSSI -TOC consult for assistance with PCP, the one listed is no longer at the practice and he goes to urgent cares Hypertriglyceridemia Triglycerides 375, HDL 27. LDL 50. ASCVD 27.8%. Pt amenable to starting statin in hospital. -atorvastatin 40 mg   Chronic and Stable Issues: HTN - continue home hydrochlorothiazide 12.5mg  QD GERD - continue esomeprazole 40mg  QD  FEN/GI: NPO pending surgical intervention PPx: Holding pharm VTE pending surgical intervention today Dispo:Home pending clinical improvement . Barriers include surgery today, likely needs PT.   Subjective:   Patient states that he is doing well.  He is amenable to starting statin medication in hospital.  States that he is very appreciative our team's work.  Objective: Temp:  [98.3 F (36.8 C)-98.5 F (36.9 C)] 98.5 F (36.9 C) (11/08 1950) Pulse Rate:  [51-70] 70 (11/08 1950) Resp:  [16-18] 18 (11/08 1950) BP: (114-149)/(87-90) 149/87 (11/08 1950) SpO2:  [98 %] 98 % (11/08 1950) Physical Exam: General: NAD, awake, alert, responsive to all questions Cardiovascular: RRR, no murmurs rubs or gallops Respiratory: Clear to auscultation bilaterally, no wheezes rales or crackles, no increased work of breathing on room air Abdomen: Soft, nontender to palpation, nondistended Extremities: No lower extremity edema, 2+ DP pulses bilaterally, nontender to palpation along left lateral side of foot where previous scar is  Laboratory: Most recent CBC Lab Results  Component Value Date   WBC 7.2 02/13/2023   HGB 15.7 02/13/2023   HCT 43.3 02/13/2023   MCV 86.4 02/13/2023   PLT 246 02/13/2023   Most recent BMP    Latest Ref Rng & Units 02/14/2023    6:15 AM  BMP  Glucose 70 - 99 mg/dL 244   BUN 8 - 23 mg/dL 18   Creatinine 0.10 - 1.24 mg/dL 2.72   Sodium 536 - 644 mmol/L 137   Potassium 3.5 - 5.1 mmol/L 3.5   Chloride 98 - 111 mmol/L 105   CO2 22 - 32 mmol/L 25   Calcium 8.9 - 10.3 mg/dL 9.1     Levin Erp, MD 02/15/2023, 2:21 AM  PGY-3, [facility] FPTS Intern pager: [phone], text pages welcome Secure chat group [facility] Teaching Service

## 2023-02-15 NOTE — Discharge Instructions (Addendum)
Dear Tyler Rangel,   Thank you so much for allowing [place] to be part of your care!  You were admitted to [facility] for left foot pain and underwent two surgeries by your podiatrist.  You were also diagnosed with diabetes.    POST-HOSPITAL & CARE INSTRUCTIONS Please let PCP/Specialists know of any changes that were made.  Please see medications section of this packet for any medication changes. Please follow up with your new PCP. Please do not place weight on your foot until directed to by your podiatrist, please keep in the boot until you see them.  DOCTOR'S APPOINTMENT & FOLLOW UP CARE INSTRUCTIONS  Future Appointments  Date Time Provider Department Center  03/10/2023  1:40 PM Angelina Sheriff, DO [facility]  03/11/2023  2:45 PM Odette Fraction, MD [facility]    RETURN PRECAUTIONS: Foot pain, redness,swelling,fevers  Take care and be well!  Family Medicine Teaching Service  [place]  [facility]  [address] [place], [place] [address] [phone]

## 2023-02-15 NOTE — Assessment & Plan Note (Signed)
Chronic osteomyelitis and left foot after complications from surgery to fix left foot fracture in August. Plan to remove infected hardware and take bone biopsies 11/9.  Asymptomatic, no MRI indicated. -Podiatry on board, will follow their recommendations -Hold IV antibiotics until cultures are obtained, consider starting after -N.p.o. pending surgery today -Hold DVT prophylaxis prior to OR, restart after -Holding home Mobic -PT after surgery -Gabapentin 400 TID -Tylenol 650 mg q6h PRN

## 2023-02-15 NOTE — Anesthesia Postprocedure Evaluation (Signed)
Anesthesia Post Note  Patient: Tyler Rangel  Procedure(s) Performed: HARDWARE REMOVAL with BONE BIOPSY. (Left)     Patient location during evaluation: PACU Anesthesia Type: MAC Level of consciousness: awake and alert Pain management: pain level controlled Vital Signs Assessment: post-procedure vital signs reviewed and stable Respiratory status: spontaneous breathing, nonlabored ventilation, respiratory function stable and patient connected to nasal cannula oxygen Cardiovascular status: stable and blood pressure returned to baseline Postop Assessment: no apparent nausea or vomiting Anesthetic complications: no   There were no known notable events for this encounter.  Last Vitals:  Vitals:   02/15/23 0915 02/15/23 0941  BP: (!) 145/91 (!) 149/75  Pulse: (!) 50 (!) 50  Resp: 13 14  Temp: 36.5 C   SpO2: 94% 98%    Last Pain:  Vitals:   02/15/23 1112  TempSrc:   PainSc: 4                  Jumaane Weatherford P Ajee Heasley

## 2023-02-15 NOTE — Assessment & Plan Note (Signed)
New onset diabetes, random glucose greater than 300 with polyuria and polydipsia.  A1C 9.0. CBGs in 200s yesterday. -increase to sSSI -TOC consult for assistance with PCP, the one listed is no longer at the practice and he goes to urgent cares

## 2023-02-15 NOTE — Brief Op Note (Signed)
02/15/2023  8:38 AM  PATIENT:  Tyler Rangel  62 y.o. male  PRE-OPERATIVE DIAGNOSIS:  Infected hardware left foot  POST-OPERATIVE DIAGNOSIS:  Infected hardware left foot  PROCEDURE:  Procedure(s): HARDWARE REMOVAL with BONE BIOPSY. (Left)  SURGEON:  Surgeons and Role:    * Luddie Boghosian, Jenelle Mages, DPM - Primary  PHYSICIAN ASSISTANT:   ASSISTANTS: none   ANESTHESIA:   local and MAC  EBL:  2 mL   BLOOD ADMINISTERED:none  DRAINS: none   LOCAL MEDICATIONS USED:  BUPIVICAINE  and LIDOCAINE   SPECIMEN:  Source of Specimen:  Multiple specimens from 5th met base for pathology and for culture  DISPOSITION OF SPECIMEN:  PATHOLOGY, MICRO  COUNTS:  YES  TOURNIQUET:   Total Tourniquet Time Documented: Ankle (Left) - 29 minutes Total: Ankle (Left) - 29 minutes   DICTATION: .Note written in EPIC  PLAN OF CARE:  Remain inpatient, further surgical plan pending results of bone biopsy and cultures. RTOR early next week.  PATIENT DISPOSITION:  PACU - hemodynamically stable.   Delay start of Pharmacological VTE agent (>24hrs) due to surgical blood loss or risk of bleeding: no

## 2023-02-15 NOTE — Assessment & Plan Note (Addendum)
Triglycerides 375, HDL 27. LDL 50. ASCVD 27.8%. Pt amenable to starting statin in hospital. -atorvastatin 40 mg

## 2023-02-15 NOTE — Transfer of Care (Signed)
Immediate Anesthesia Transfer of Care Note  Patient: Tyler Rangel  Procedure(s) Performed: HARDWARE REMOVAL with BONE BIOPSY. (Left)  Patient Location: PACU  Anesthesia Type:MAC  Level of Consciousness: awake  Airway & Oxygen Therapy: Patient Spontanous Breathing  Post-op Assessment: Report given to RN  Post vital signs: Reviewed  Last Vitals:  Vitals Value Taken Time  BP 126/75 02/15/23 0845  Temp    Pulse 49 02/15/23 0849  Resp 13 02/15/23 0849  SpO2 98 % 02/15/23 0849  Vitals shown include unfiled device data.  Last Pain:  Vitals:   02/15/23 0635  TempSrc: Oral  PainSc:          Complications: There were no known notable events for this encounter.

## 2023-02-15 NOTE — Progress Notes (Signed)
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  The various methods of treatment have been discussed with the patient and family. After consideration of risks, benefits and other options for treatment, the patient has consented to   Procedure(s) with comments: HARDWARE REMOVAL (Left) - Left foot hardware removal, abx bead applicaiton, bone biopsy, possible wound vac as a surgical intervention.  The patient's history has been reviewed, patient examined, no change in status, stable for surgery.  I have reviewed the patient's chart and labs.  Questions were answered to the patient's satisfaction.     Jenelle Mages Mihira Tozzi

## 2023-02-15 NOTE — Progress Notes (Signed)
Orthopedic Tech Progress Note Patient Details:  Tyler Rangel 1960/06/15 [id number]  Ortho Devices Type of Ortho Device: CAM walker Ortho Device/Splint Location: LLE Ortho Device/Splint Interventions: Ordered, Application, Adjustment   Post Interventions Patient Tolerated: Well Instructions Provided: Care of device, Adjustment of device  Deisha Stull Carmine Savoy 02/15/2023, 9:17 AM

## 2023-02-16 ENCOUNTER — Encounter (HOSPITAL_COMMUNITY): Payer: Self-pay | Admitting: Podiatry

## 2023-02-16 ENCOUNTER — Other Ambulatory Visit: Payer: Self-pay

## 2023-02-16 DIAGNOSIS — L089 Local infection of the skin and subcutaneous tissue, unspecified: Secondary | ICD-10-CM | POA: Diagnosis not present

## 2023-02-16 DIAGNOSIS — M86672 Other chronic osteomyelitis, left ankle and foot: Secondary | ICD-10-CM | POA: Diagnosis not present

## 2023-02-16 DIAGNOSIS — E11628 Type 2 diabetes mellitus with other skin complications: Secondary | ICD-10-CM | POA: Diagnosis not present

## 2023-02-16 DIAGNOSIS — T847XXD Infection and inflammatory reaction due to other internal orthopedic prosthetic devices, implants and grafts, subsequent encounter: Secondary | ICD-10-CM | POA: Diagnosis not present

## 2023-02-16 LAB — CBC
HCT: 37 % — ABNORMAL LOW (ref 39.0–52.0)
Hemoglobin: 13.5 g/dL (ref 13.0–17.0)
MCH: 31.4 pg (ref 26.0–34.0)
MCHC: 36.5 g/dL — ABNORMAL HIGH (ref 30.0–36.0)
MCV: 86 fL (ref 80.0–100.0)
Platelets: 183 10*3/uL (ref 150–400)
RBC: 4.3 MIL/uL (ref 4.22–5.81)
RDW: 12.6 % (ref 11.5–15.5)
WBC: 6.7 10*3/uL (ref 4.0–10.5)
nRBC: 0 % (ref 0.0–0.2)

## 2023-02-16 LAB — BASIC METABOLIC PANEL
Anion gap: 9 (ref 5–15)
BUN: 22 mg/dL (ref 8–23)
CO2: 25 mmol/L (ref 22–32)
Calcium: 8.8 mg/dL — ABNORMAL LOW (ref 8.9–10.3)
Chloride: 102 mmol/L (ref 98–111)
Creatinine, Ser: 1.12 mg/dL (ref 0.61–1.24)
GFR, Estimated: >60 mL/min (ref >60)
Glucose, Bld: 192 mg/dL — ABNORMAL HIGH (ref 70–99)
Potassium: 3.4 mmol/L — ABNORMAL LOW (ref 3.5–5.1)
Sodium: 136 mmol/L (ref 135–145)

## 2023-02-16 LAB — GLUCOSE, CAPILLARY
Glucose-Capillary: 213 mg/dL — ABNORMAL HIGH (ref 70–99)
Glucose-Capillary: 159 mg/dL — ABNORMAL HIGH (ref 70–99)
Glucose-Capillary: 204 mg/dL — ABNORMAL HIGH (ref 70–99)
Glucose-Capillary: 177 mg/dL — ABNORMAL HIGH (ref 70–99)

## 2023-02-16 MED ORDER — CYCLOBENZAPRINE HCL 5 MG PO TABS
5.0000 mg | ORAL_TABLET | Freq: Three times a day (TID) | ORAL | Status: DC
  Administered 2023-02-16 – 2023-02-24 (×24): 5 mg via ORAL
  Filled 2023-02-16 (×24): qty 1

## 2023-02-16 MED ORDER — INSULIN ASPART 100 UNIT/ML IJ SOLN
0.0000 [IU] | Freq: Three times a day (TID) | INTRAMUSCULAR | Status: DC
  Administered 2023-02-16: 5 [IU] via SUBCUTANEOUS
  Administered 2023-02-17 (×2): 2 [IU] via SUBCUTANEOUS
  Administered 2023-02-17 – 2023-02-18 (×2): 3 [IU] via SUBCUTANEOUS
  Administered 2023-02-19: 2 [IU] via SUBCUTANEOUS
  Administered 2023-02-19: 3 [IU] via SUBCUTANEOUS
  Administered 2023-02-20: 2 [IU] via SUBCUTANEOUS
  Administered 2023-02-20 – 2023-02-21 (×2): 3 [IU] via SUBCUTANEOUS
  Administered 2023-02-21: 2 [IU] via SUBCUTANEOUS
  Administered 2023-02-22: 5 [IU] via SUBCUTANEOUS
  Administered 2023-02-22: 2 [IU] via SUBCUTANEOUS
  Administered 2023-02-22 – 2023-02-23 (×3): 3 [IU] via SUBCUTANEOUS
  Administered 2023-02-23: 5 [IU] via SUBCUTANEOUS
  Administered 2023-02-24: 2 [IU] via SUBCUTANEOUS
  Filled 2023-02-16: qty 1

## 2023-02-16 MED ORDER — CEFAZOLIN SODIUM-DEXTROSE 2-4 GM/100ML-% IV SOLN
2.0000 g | Freq: Three times a day (TID) | INTRAVENOUS | Status: DC
  Administered 2023-02-16 – 2023-02-17 (×3): 2 g via INTRAVENOUS
  Filled 2023-02-16 (×3): qty 100

## 2023-02-16 MED ORDER — ENOXAPARIN SODIUM 40 MG/0.4ML IJ SOSY
40.0000 mg | PREFILLED_SYRINGE | INTRAMUSCULAR | Status: DC
  Administered 2023-02-16 – 2023-02-17 (×2): 40 mg via SUBCUTANEOUS
  Filled 2023-02-16 (×2): qty 0.4

## 2023-02-16 MED ORDER — IBUPROFEN 200 MG PO TABS
600.0000 mg | ORAL_TABLET | Freq: Four times a day (QID) | ORAL | Status: DC | PRN
  Administered 2023-02-16 – 2023-02-19 (×3): 600 mg via ORAL
  Filled 2023-02-16 (×4): qty 3

## 2023-02-16 NOTE — Progress Notes (Signed)
   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 Newly diagnosed DM A1c 9.   POD 1 s/p hardware removal and multiple bone biopsy 5th met base  AF, VSS  WBC: 6.7 ESR/CRP: CRP 1.9 ESR 1  Wound/Bone Cultures: Rare GNR, few GPCs  Imaging: 11/9 post op XR L foot 3 views NWB: Interval removal of surgical hardware involving old fracture of proximal fifth metatarsal.  Plan:  - Plan for return to OR this week for additional procedure will be either resection of 5th met base with peroneus brevis tendon transfer or reduction possible external fixation. This will depend on culture and pathology data.  - Prelim data with bone culture suggests infection at the nonunion site rare GNR and few GPCs. Leaning towards 5th met base resection with tendon transfer. -Request ID consult for further input, recommend 6 weeks IV abx upon DC - NWB in CAM boot to LLE - ok to begin abx per ID recs - would reach out to them prior to initiating  Will continue to follow        Corinna Gab, DPM [facility]    Subjective:  Patient seen bedside, discussed findings from OR yesterday as well as further plans. All questions answered. He is in agreement of plan and appreciative of care.   Objective:   Vitals:   02/16/23 0048 02/16/23 0449  BP: 122/76 108/72  Pulse: (!) 59 (!) 56  Resp: 16 16  Temp: 98.3 F (36.8 C) 97.8 F (36.6 C)  SpO2: 98% 96%       Latest Ref Rng & Units 02/16/2023    5:45 AM 02/15/2023    5:42 AM 02/13/2023    2:54 PM  CBC  WBC 4.0 - 10.5 K/uL 6.7  6.8  7.2   Hemoglobin 13.0 - 17.0 g/dL 40.9  81.1  91.4   Hematocrit 39.0 - 52.0 % 37.0  38.2  43.3   Platelets 150 - 400 K/uL 183  199  246        Latest Ref Rng & Units 02/16/2023    5:45 AM 02/15/2023    5:42 AM 02/14/2023    6:15 AM  BMP  Glucose 70 - 99 mg/dL 782  956  213    BUN 8 - 23 mg/dL 22  20  18    Creatinine 0.61 - 1.24 mg/dL 0.86  5.78  4.69   Sodium 135 - 145 mmol/L 136  134  137   Potassium 3.5 - 5.1 mmol/L 3.4  3.4  3.5   Chloride 98 - 111 mmol/L 102  100  105   CO2 22 - 32 mmol/L 25  24  25    Calcium 8.9 - 10.3 mg/dL 8.8  8.7  9.1     General: AAOx3, NAD  Lower Extremity Exam Dressing C/DI to left foot CFT intact < 3 sec to all digits Pain on palpation of surgical site Sensation intact to light touch LLE  Radiology:  Results reviewed. See assessment for pertinent imaging results

## 2023-02-16 NOTE — Consult Note (Addendum)
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 male with T2DM, history of having fall in with 5th MT fracture requiring HW. Has been off and on oral abtx and followed in ID clinic by Dr Elinor Parkinson at end of October with the plan to hold off on abtx and have patient undergo HW removal and reculture. He was admitted for schedule surgery on on 11/09 for removal of deep HW of left foot of the nonunion area of 5th MT, also had multiple bone biopsy sent which showed GPC and GNR. Patient has hx of MSSA noted last month.Will go to the OR later this week with podiatry to due further resection of the 5th MH and ID asked to weigh in on treatment of osteomyelitis. He remains afebrile, no leukocytosis. Inflammatory markers are flat. He is not colonized with MRSA. 10/7 cultures showing MSSA  Lab Results  Component Value Date   ESRSEDRATE 1 02/14/2023   Lab Results  Component Value Date   CRP 1.9 (H) 02/14/2023        Past Medical History:  Diagnosis Date   Diabetes mellitus without complication (HCC)    GERD (gastroesophageal reflux disease)     Allergies:  Allergies  Allergen Reactions   Hydrocodone Nausea And Vomiting    MEDICATIONS:  acetaminophen  1,000 mg Oral Q6H   atorvastatin  40 mg Oral Daily   cyclobenzaprine  5 mg Oral TID   enoxaparin (LOVENOX) injection  40 mg Subcutaneous Q24H   gabapentin  400 mg Oral TID   hydrochlorothiazide  12.5 mg Oral Daily   insulin aspart  0-6 Units Subcutaneous TID WC   pantoprazole  80 mg Oral Q1200    Social History   Tobacco Use   Smoking status: Never   Smokeless  tobacco: Never  Vaping Use   Vaping status: Never Used  Substance Use Topics   Alcohol use: Never   Drug use: Never    History reviewed. No pertinent family history.   Review of Systems  Constitutional: Negative for fever, chills, diaphoresis, activity change, appetite change, fatigue and unexpected weight change.  HENT: Negative for congestion, sore throat, rhinorrhea, sneezing, trouble swallowing and sinus pressure.  Eyes: Negative for photophobia and visual disturbance.  Respiratory: Negative for cough, chest tightness, shortness of breath, wheezing and stridor.  Cardiovascular: Negative for chest pain, palpitations and leg swelling.  Gastrointestinal: Negative for nausea, vomiting, abdominal pain, diarrhea, constipation, blood in stool, abdominal distention and anal bleeding.  Genitourinary: Negative for dysuria, hematuria, flank pain and difficulty urinating.  Musculoskeletal: Negative for myalgias, back pain, joint swelling, arthralgias and gait problem.  Skin: Negative for color change, pallor, rash and wound.  Neurological: Negative for dizziness, tremors, weakness and light-headedness.  Hematological: Negative for adenopathy. Does not bruise/bleed easily.  Psychiatric/Behavioral: Negative for behavioral problems, confusion, sleep disturbance, dysphoric mood, decreased concentration and agitation.    OBJECTIVE: Temp:  [97.7 F (36.5 C)-98.7 F (37.1 C)] 97.7 F (36.5 C) (11/10 1327) Pulse Rate:  [56-63] 58 (11/10 1327) Resp:  [14-18] 18 (11/10 1327) BP: (108-139)/(69-86) 133/86 (  11/10 1327) SpO2:  [96 %-98 %] 97 % (11/10 1327) Physical Exam  Constitutional: He is oriented to person, place, and time. He appears well-developed and well-nourished. No distress.  HENT:  Mouth/Throat: Oropharynx is clear and moist. No oropharyngeal exudate.  Cardiovascular: Normal rate, regular rhythm and normal heart sounds. Exam reveals no gallop and no friction rub.  No murmur heard.   Pulmonary/Chest: Effort normal and breath sounds normal. No respiratory distress. He has no wheezes.  Abdominal: Soft. Bowel sounds are normal. He exhibits no distension. There is no tenderness.  Ext: foot wrapped Skin: Skin is warm and dry. No rash noted. No erythema.  Psychiatric: He has a normal mood and affect. His behavior is normal.   LABS: Results for orders placed or performed during the hospital encounter of 02/13/23 (from the past 48 hour(s))  Glucose, capillary     Status: Abnormal   Collection Time: 02/14/23  4:12 PM  Result Value Ref Range   Glucose-Capillary 193 (H) 70 - 99 mg/dL    Comment: Glucose reference range applies only to samples taken after fasting for at least 8 hours.  Glucose, capillary     Status: Abnormal   Collection Time: 02/14/23  7:52 PM  Result Value Ref Range   Glucose-Capillary 287 (H) 70 - 99 mg/dL    Comment: Glucose reference range applies only to samples taken after fasting for at least 8 hours.  Sedimentation rate     Status: None   Collection Time: 02/14/23  8:44 PM  Result Value Ref Range   Sed Rate 1 0 - 16 mm/hr    Comment: Performed at [facility], [address], [place], [place] [address]  C-reactive protein     Status: Abnormal   Collection Time: 02/14/23  8:44 PM  Result Value Ref Range   CRP 1.9 (H) <1.0 mg/dL    Comment: Performed at [facility], [address], [place], [place] [address]  Surgical pcr screen     Status: None   Collection Time: 02/14/23 10:53 PM   Specimen: Nasal Mucosa; Nasal Swab  Result Value Ref Range   MRSA, PCR NEGATIVE NEGATIVE   Staphylococcus aureus NEGATIVE NEGATIVE    Comment: (NOTE) The Xpert SA Assay (FDA approved for NASAL specimens in patients 38 years of age and older), is one component of a comprehensive surveillance program. It is not intended to diagnose infection nor to guide or monitor treatment. Performed at [facility], [address], [place], [place] [address]    CBC     Status: Abnormal   Collection Time: 02/15/23  5:42 AM  Result Value Ref Range   WBC 6.8 4.0 - 10.5 K/uL   RBC 4.45 4.22 - 5.81 MIL/uL   Hemoglobin 14.3 13.0 - 17.0 g/dL   HCT 34.7 (L) 42.5 - 95.6 %   MCV 85.8 80.0 - 100.0 fL   MCH 32.1 26.0 - 34.0 pg   MCHC 37.4 (H) 30.0 - 36.0 g/dL   RDW 38.7 56.4 - 33.2 %   Platelets 199 150 - 400 K/uL   nRBC 0.0 0.0 - 0.2 %    Comment: Performed at [facility], [address] [place], [place] [address]  Basic metabolic panel     Status: Abnormal   Collection Time: 02/15/23  5:42 AM  Result Value Ref Range   Sodium 134 (L) 135 - 145 mmol/L   Potassium 3.4 (L) 3.5 - 5.1 mmol/L   Chloride  100 98 - 111 mmol/L   CO2 24 22 - 32 mmol/L   Glucose, Bld 249 (H) 70 - 99 mg/dL    Comment: Glucose reference range applies only to samples taken after fasting for at least 8 hours.   BUN 20 8 - 23 mg/dL   Creatinine, Ser 1.61 0.61 - 1.24 mg/dL   Calcium 8.7 (L) 8.9 - 10.3 mg/dL   GFR, Estimated >09 >60 mL/min    Comment: (NOTE) Calculated using the CKD-EPI Creatinine Equation (2021)    Anion gap 10 5 - 15    Comment: Performed at [facility], [address] [place], [place] [address]  Glucose, capillary     Status: Abnormal   Collection Time: 02/15/23  5:58 AM  Result Value Ref Range   Glucose-Capillary 238 (H) 70 - 99 mg/dL    Comment: Glucose reference range applies only to samples taken after fasting for at least 8 hours.  Aerobic/Anaerobic Culture w Gram Stain (surgical/deep wound)     Status: None (Preliminary result)   Collection Time: 02/15/23  8:10 AM   Specimen: PATH Gross Only; Tissue  Result Value Ref Range   Specimen Description OTHER    Special Requests left foot explanted hardware    Gram Stain      RARE WBC SEEN RARE GRAM POSITIVE COCCI Performed at [facility], [address], [place], [place] [address]    Culture PENDING    Report Status PENDING   Aerobic/Anaerobic Culture w Gram Stain  (surgical/deep wound)     Status: None (Preliminary result)   Collection Time: 02/15/23  8:13 AM   Specimen: PATH Bone biopsy; Tissue  Result Value Ref Range   Specimen Description TISSUE    Special Requests fifth metatarsal base    Gram Stain      RARE WBC SEEN FEW GRAM POSITIVE COCCI RARE GRAM NEGATIVE RODS Performed at [facility], [address], [place], [place] [address]    Culture PENDING    Report Status PENDING   Aerobic/Anaerobic Culture w Gram Stain (surgical/deep wound)     Status: None (Preliminary result)   Collection Time: 02/15/23  8:18 AM   Specimen: PATH Bone biopsy; Tissue  Result Value Ref Range   Specimen Description TISSUE    Special Requests fifth metatrsal non union    Gram Stain      RARE GRAM NEGATIVE RODS FEW GRAM POSITIVE COCCI Performed at [facility], [address] [place], [place] [address]    Culture PENDING    Report Status PENDING   Glucose, capillary     Status: Abnormal   Collection Time: 02/15/23  8:45 AM  Result Value Ref Range   Glucose-Capillary 180 (H) 70 - 99 mg/dL    Comment: Glucose reference range applies only to samples taken after fasting for at least 8 hours.  Glucose, capillary     Status: Abnormal   Collection Time: 02/15/23 11:25 AM  Result Value Ref Range   Glucose-Capillary 140 (H) 70 - 99 mg/dL    Comment: Glucose reference range applies only to samples taken after fasting for at least 8 hours.  Glucose, capillary     Status: Abnormal   Collection Time: 02/15/23  4:21 PM  Result Value Ref Range   Glucose-Capillary 191 (H) 70 - 99 mg/dL    Comment: Glucose reference range applies only to samples taken after fasting for at least 8 hours.  Glucose, capillary  Status: Abnormal   Collection Time: 02/15/23  7:57 PM  Result Value Ref Range   Glucose-Capillary 241 (H) 70 - 99 mg/dL    Comment: Glucose reference range applies only to samples taken after fasting for at least 8 hours.   Comment 1 Notify RN    Basic metabolic panel     Status: Abnormal   Collection Time: 02/16/23  5:45 AM  Result Value Ref Range   Sodium 136 135 - 145 mmol/L   Potassium 3.4 (L) 3.5 - 5.1 mmol/L   Chloride 102 98 - 111 mmol/L   CO2 25 22 - 32 mmol/L   Glucose, Bld 192 (H) 70 - 99 mg/dL    Comment: Glucose reference range applies only to samples taken after fasting for at least 8 hours.   BUN 22 8 - 23 mg/dL   Creatinine, Ser 1.61 0.61 - 1.24 mg/dL   Calcium 8.8 (L) 8.9 - 10.3 mg/dL   GFR, Estimated >09 >60 mL/min    Comment: (NOTE) Calculated using the CKD-EPI Creatinine Equation (2021)    Anion gap 9 5 - 15    Comment: Performed at [facility], [address] [place], [place] [address]  CBC     Status: Abnormal   Collection Time: 02/16/23  5:45 AM  Result Value Ref Range   WBC 6.7 4.0 - 10.5 K/uL   RBC 4.30 4.22 - 5.81 MIL/uL   Hemoglobin 13.5 13.0 - 17.0 g/dL   HCT 81.1 (L) 91.4 - 78.2 %   MCV 86.0 80.0 - 100.0 fL   MCH 31.4 26.0 - 34.0 pg   MCHC 36.5 (H) 30.0 - 36.0 g/dL   RDW 95.6 21.3 - 08.6 %   Platelets 183 150 - 400 K/uL   nRBC 0.0 0.0 - 0.2 %    Comment: Performed at [facility], [address], [place], [place] [address]  Glucose, capillary     Status: Abnormal   Collection Time: 02/16/23  9:03 AM  Result Value Ref Range   Glucose-Capillary 213 (H) 70 - 99 mg/dL    Comment: Glucose reference range applies only to samples taken after fasting for at least 8 hours.  Glucose, capillary     Status: Abnormal   Collection Time: 02/16/23 11:54 AM  Result Value Ref Range   Glucose-Capillary 159 (H) 70 - 99 mg/dL    Comment: Glucose reference range applies only to samples taken after fasting for at least 8 hours.    MICRO: pending IMAGING: DG Foot Complete Left  Result Date: 02/15/2023 CLINICAL DATA:  Postoperative state. EXAM: LEFT FOOT - COMPLETE 3+ VIEW COMPARISON:  February 13, 2023. FINDINGS: There is been interval removal of surgical hardware involving old fracture of  proximal fifth metatarsal. Stable mild hallux valgus deformity of first metatarsophalangeal joint. No new fracture or dislocation. IMPRESSION: Interval removal of surgical hardware involving old fracture of proximal fifth metatarsal. Electronically Signed   By: Lupita Raider M.D.   On: 02/15/2023 12:59   DG MINI C-ARM IMAGE ONLY  Result Date: 02/15/2023 There is no interpretation for this exam.  This order is for images obtained during a surgical procedure.  Please See "Surgeries" Tab for more information regarding the procedure.     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 Hx of MSSA + cx (oct 2024)  - recommend to start cefazolin 2gm IV q 8hr. Which does provide some GNR coverage. HW may have been the  nidus for chronic infection over the last 7 months - will follow cultures to see if need to change abtx - will arrange for picc line and treatment depending on results  -please ensure better T2DM control to help with healing.  Duke Salvia Drue Second MD MPH [facility] [phone]

## 2023-02-16 NOTE — Progress Notes (Signed)
     Daily Progress Note Intern Pager: [phone]  Patient name: Tyler Rangel Medical record number: [id number] Date of birth: Sep 23, 1960 Age: 62 y.o. Gender: male  Primary Care Provider: Jerrye Bushy, FNP Consultants: Podiatry Code Status: Full  Pt Overview and Major Events to Date:  11/7-admitted 11/9-bone biopsy/hardware removal of left foot  Assessment and Plan:  Tyler Rangel is a 62 year old male PMH of HTN, GERD, left foot pain in the setting of chronic osteomyelitis secondary to left foot surgery several months ago.  Podiatry planning on bone biopsy today.  Holding antibiotics pending surgical cultures being obtained.  Assessment & Plan Osteomyelitis (HCC) Chronic osteomyelitis and left foot after complications from surgery to fix left foot fracture in August. Asymptomatic, no MRI indicated. Pt had infected hardware removed and bone biopsies collected 11/9. Pain poorly controlled overnight as he forgot to request pain meds.   -Podiatry on board, will follow their recommendations -Hold IV antibiotics until cultures are obtained, consider starting after -Holding home Mobic -PT after surgery -Gabapentin 400 TID -Tylenol 650 mg q6h PRN Diabetes mellitus, new onset (HCC) New onset diabetes, random glucose greater than 300 with polyuria and polydipsia.  A1C 9.0. CBGs in 240s yesterday, but given surgery yesterday, will continue SSI today and consider long acting tomorrow.  - sSSI -TOC consult for assistance with PCP, the one listed is no longer at the practice and he goes to urgent cares Hypertriglyceridemia Triglycerides 375, HDL 27. LDL 50. ASCVD 27.8%. Pt amenable to starting statin in hospital. -atorvastatin 40 mg    Chronic and Stable Issues: HTN - continue home hydrochlorothiazide 12.5mg  QD GERD - continue esomeprazole 40mg  QD  FEN/GI: Carb Modified PPx: Lovenox Dispo:Home pending clinical improvement .    Subjective:  Patient pain poorly controlled overnight, but  improved this morning after pain medicine.  Patient forgot to ask for pain meds overnight.  Patient reports pain better controlled this morning.  Patient also wanting to discuss disability with PCP this morning.  Objective: Temp:  [97.1 F (36.2 C)-98 F (36.7 C)] 98 F (36.7 C) (11/09 1959) Pulse Rate:  [50-63] 63 (11/09 1959) Resp:  [12-20] 18 (11/09 1959) BP: (116-149)/(69-91) 116/69 (11/09 1959) SpO2:  [94 %-100 %] 96 % (11/09 1959) Weight:  [82.1 kg] 82.1 kg (11/09 1478) Physical Exam: General: NAD, well appearing, conversant Cardiovascular: RRR, NR[facility] Respiratory: CTABL, no wheezes, good WOB Abdomen: Soft, NTTP, non-distended Extremities: Left extremity in brace, and bandaged, able to move toes  Laboratory: Most recent CBC Lab Results  Component Value Date   WBC 6.8 02/15/2023   HGB 14.3 02/15/2023   HCT 38.2 (L) 02/15/2023   MCV 85.8 02/15/2023   PLT 199 02/15/2023   Most recent BMP    Latest Ref Rng & Units 02/15/2023    5:42 AM  BMP  Glucose 70 - 99 mg/dL 295   BUN 8 - 23 mg/dL 20   Creatinine 6.21 - 1.24 mg/dL 3.08   Sodium 657 - 846 mmol/L 134   Potassium 3.5 - 5.1 mmol/L 3.4   Chloride 98 - 111 mmol/L 100   CO2 22 - 32 mmol/L 24   Calcium 8.9 - 10.3 mg/dL 8.7      Tyler Kinds, MD 02/16/2023, 12:33 AM  PGY-3, [facility] FPTS Intern pager: [phone], text pages welcome Secure chat group [facility] Teaching Service

## 2023-02-16 NOTE — Assessment & Plan Note (Addendum)
Chronic osteomyelitis and left foot after complications from surgery to fix left foot fracture in August. Asymptomatic, no MRI indicated. Pt had infected hardware removed and bone biopsies collected 11/9. Pain poorly controlled overnight as he forgot to request pain meds.   -Podiatry on board, will follow their recommendations -Hold IV antibiotics until cultures are obtained, consider starting after -Holding home Mobic -PT after surgery -Gabapentin 400 TID -Tylenol 650 mg q6h PRN

## 2023-02-16 NOTE — Progress Notes (Signed)
Physical Therapy Note  PT eval complete with full note to follow;  Recommend DC home for transition out of hospital;  Has been managing at home with NWB status for quite some time, and is managing well in the room;  Pt's wife to bring in his knee scooter;  Will plan to check on pt postop;  Anticipate he will do well  Van Clines, PT  Acute Rehabilitation Services Pager [phone] Office [phone]

## 2023-02-16 NOTE — Assessment & Plan Note (Signed)
Triglycerides 375, HDL 27. LDL 50. ASCVD 27.8%. Pt amenable to starting statin in hospital. -atorvastatin 40 mg

## 2023-02-16 NOTE — Evaluation (Signed)
Physical Therapy Evaluation Patient Details Name: Tyler Rangel MRN: [id number] DOB: 10-22-1960 Today's Date: 02/16/2023  History of Present Illness  Tyler Rangel is a 62 year old male  left foot pain in the setting of chronic osteomyelitis secondary to left foot surgery several months ago; s/p removal of deep hardware and open biopsy; plan for furtehr surgery on 11/12 or 11/13; PMH of HTN, GERD,  Clinical Impression   Pt admitted with above diagnosis. Lives at home with his wife, in a single-level home with 2 steps to enter from garage; Prior to admission, pt was able to walk in house with cruthces, knee scooter -- has been managing with NWB LLE for months now; Presents to PT with continued LLE weight bearing restrictions, mild deficits with balance;  Used knee scooter in the room overall well, with small loss of balance managing in the batrhoom; Anticipate good progress, and pt's wife is bringing his own knee scooter in; Will plan to check on pt postop, after his next surgery - Tues or wed; Pt currently with functional limitations due to the deficits listed below (see PT Problem List). Pt will benefit from skilled PT to increase their independence and safety with mobility to allow discharge to the venue listed below.           If plan is discharge home, recommend the following: Assist for transportation;Assistance with cooking/housework   Can travel by private vehicle        Equipment Recommendations None recommended by PT  Recommendations for Other Services       Functional Status Assessment Patient has had a recent decline in their functional status and demonstrates the ability to make significant improvements in function in a reasonable and predictable amount of time.     Precautions / Restrictions Precautions Precautions: None Restrictions LLE Weight Bearing: Non weight bearing Other Position/Activity Restrictions: in cam boot      Mobility  Bed Mobility Overal bed mobility:  Independent                  Transfers Overall transfer level: Needs assistance Equipment used:  (knee scooter) Transfers: Sit to/from Stand Sit to Stand: Supervision           General transfer comment: Managing well; supervision for safety    Ambulation/Gait Ambulation/Gait assistance: Supervision, Contact guard assist Gait Distance (Feet): 15 Feet Assistive device:  (knee scooter)         General Gait Details: used knee scooter for access to bathroom; managed well in bathroom and pts' room; contact guard with a turn in the bathroom with small loss of balance, but pt able to correct without physical assist  Stairs            Wheelchair Mobility     Tilt Bed    Modified Rankin (Stroke Patients Only)       Balance                                             Pertinent Vitals/Pain Pain Assessment Pain Assessment: Faces Faces Pain Scale: Hurts a little bit Pain Location: L foot Pain Descriptors / Indicators: Grimacing, Guarding Pain Intervention(s): Monitored during session, Premedicated before session    Home Living Family/patient expects to be discharged to:: Private residence Living Arrangements: Spouse/significant other Available Help at Discharge: Family Type of Home: House Home Access: Stairs to enter Entrance Stairs-Rails: None  Entrance Stairs-Number of Steps: 2 (from garage; Need to verify with pt)   Home Layout: One level Home Equipment: Crutches (Knee scooter)      Prior Function Prior Level of Function : Independent/Modified Independent             Mobility Comments: Has been managing at NWB LLE for a few months; typically uses a knee scooter at home       Extremity/Trunk Assessment   Upper Extremity Assessment Upper Extremity Assessment: Overall WFL for tasks assessed    Lower Extremity Assessment Lower Extremity Assessment: LLE deficits/detail LLE Deficits / Details: L ankle and foot immobilized  in cam Boot; adequate hip and knee ROM and strength for typical moblity activities    Cervical / Trunk Assessment Cervical / Trunk Assessment: Normal  Communication   Communication Communication: No apparent difficulties  Cognition Arousal: Alert Behavior During Therapy: WFL for tasks assessed/performed Overall Cognitive Status: Within Functional Limits for tasks assessed                                          General Comments General comments (skin integrity, edema, etc.): Wife present and helpful; plans to bring in pt's own knee scooter    Exercises     Assessment/Plan    PT Assessment Patient needs continued PT services  PT Problem List Decreased balance;Decreased mobility       PT Treatment Interventions DME instruction;Gait training;Stair training;Functional mobility training;Therapeutic activities;Therapeutic exercise;Balance training;Patient/family education    PT Goals (Current goals can be found in the Care Plan section)  Acute Rehab PT Goals Patient Stated Goal: Wants to be done battling this infection, and for the L foot to heal PT Goal Formulation: With patient Time For Goal Achievement: 03/02/23 Potential to Achieve Goals: Good    Frequency Min 1X/week     Co-evaluation               AM-PAC PT "6 Clicks" Mobility  Outcome Measure Help needed turning from your back to your side while in a flat bed without using bedrails?: None Help needed moving from lying on your back to sitting on the side of a flat bed without using bedrails?: None Help needed moving to and from a bed to a chair (including a wheelchair)?: None Help needed standing up from a chair using your arms (e.g., wheelchair or bedside chair)?: None Help needed to walk in hospital room?: A Little Help needed climbing 3-5 steps with a railing? : A Lot 6 Click Score: 21    End of Session Equipment Utilized During Treatment: Gait belt Activity Tolerance: Patient tolerated  treatment well Patient left: in chair;with call bell/phone within reach;Other (comment) (readying for lunch) Nurse Communication: Mobility status PT Visit Diagnosis: Other abnormalities of gait and mobility (R26.89)    Time: 1209 (in and out times are approximate)-1238 PT Time Calculation (min) (ACUTE ONLY): 29 min   Charges:   PT Evaluation $PT Eval Low Complexity: 1 Low PT Treatments $Gait Training: 8-22 mins PT General Charges $$ ACUTE PT VISIT: 1 Visit         Van Clines, PT  Acute Rehabilitation Services Office [phone] Secure Chat welcomed   Levi Aland 02/16/2023, 6:24 PM

## 2023-02-16 NOTE — Plan of Care (Signed)

## 2023-02-16 NOTE — Assessment & Plan Note (Addendum)
New onset diabetes, random glucose greater than 300 with polyuria and polydipsia.  A1C 9.0. CBGs in 240s yesterday, but given surgery yesterday, will continue SSI today and consider long acting tomorrow.  - sSSI -TOC consult for assistance with PCP, the one listed is no longer at the practice and he goes to urgent cares

## 2023-02-17 DIAGNOSIS — L089 Local infection of the skin and subcutaneous tissue, unspecified: Secondary | ICD-10-CM | POA: Diagnosis not present

## 2023-02-17 LAB — GLUCOSE, CAPILLARY
Glucose-Capillary: 200 mg/dL — ABNORMAL HIGH (ref 70–99)
Glucose-Capillary: 131 mg/dL — ABNORMAL HIGH (ref 70–99)
Glucose-Capillary: 253 mg/dL — ABNORMAL HIGH (ref 70–99)
Glucose-Capillary: 143 mg/dL — ABNORMAL HIGH (ref 70–99)

## 2023-02-17 LAB — BASIC METABOLIC PANEL
Anion gap: 7 (ref 5–15)
BUN: 16 mg/dL (ref 8–23)
CO2: 27 mmol/L (ref 22–32)
Calcium: 8.8 mg/dL — ABNORMAL LOW (ref 8.9–10.3)
Chloride: 102 mmol/L (ref 98–111)
Creatinine, Ser: 1.08 mg/dL (ref 0.61–1.24)
GFR, Estimated: >60 mL/min (ref >60)
Glucose, Bld: 201 mg/dL — ABNORMAL HIGH (ref 70–99)
Potassium: 3.4 mmol/L — ABNORMAL LOW (ref 3.5–5.1)
Sodium: 136 mmol/L (ref 135–145)

## 2023-02-17 MED ORDER — SODIUM CHLORIDE 0.9% FLUSH
10.0000 mL | INTRAVENOUS | Status: DC | PRN
  Administered 2023-02-24: 10 mL

## 2023-02-17 MED ORDER — SENNOSIDES-DOCUSATE SODIUM 8.6-50 MG PO TABS
2.0000 | ORAL_TABLET | Freq: Two times a day (BID) | ORAL | Status: DC
  Administered 2023-02-17 – 2023-02-20 (×7): 2 via ORAL
  Filled 2023-02-17 (×7): qty 2

## 2023-02-17 MED ORDER — CHLORHEXIDINE GLUCONATE CLOTH 2 % EX PADS
6.0000 | MEDICATED_PAD | Freq: Every day | CUTANEOUS | Status: DC
Start: 2023-02-17 — End: 2023-02-24
  Administered 2023-02-17 – 2023-02-24 (×8): 6 via TOPICAL

## 2023-02-17 MED ORDER — POLYETHYLENE GLYCOL 3350 17 G PO PACK
17.0000 g | PACK | Freq: Every day | ORAL | Status: DC | PRN
  Filled 2023-02-17: qty 1

## 2023-02-17 MED ORDER — SODIUM CHLORIDE 0.9 % IV SOLN
2.0000 g | Freq: Three times a day (TID) | INTRAVENOUS | Status: DC
  Administered 2023-02-17 – 2023-02-21 (×12): 2 g via INTRAVENOUS
  Filled 2023-02-17 (×12): qty 12.5

## 2023-02-17 MED ORDER — SODIUM CHLORIDE 0.9% FLUSH
10.0000 mL | Freq: Two times a day (BID) | INTRAVENOUS | Status: DC
  Administered 2023-02-17 – 2023-02-24 (×10): 10 mL

## 2023-02-17 MED ORDER — POTASSIUM CHLORIDE CRYS ER 20 MEQ PO TBCR
20.0000 meq | EXTENDED_RELEASE_TABLET | Freq: Once | ORAL | Status: AC
  Administered 2023-02-17: 20 meq via ORAL
  Filled 2023-02-17: qty 1

## 2023-02-17 NOTE — Progress Notes (Addendum)
Subjective:  Patient ID: Tyler Rangel, male    DOB: [birth year],  MRN: [id number]  POD #2 from removal of hardware and bone biopsy and culture  Negative for chest pain and shortness of breath Fever: no Night sweats: no Weight loss: no Objective:   Vitals:   02/17/23 0534 02/17/23 0748  BP: 125/80 132/81  Pulse: (!) 54 (!) 55  Resp: 19 17  Temp: 98 F (36.7 C) 98.2 F (36.8 C)  SpO2: 99% 99%   General AA&O x3. Normal mood and affect.  Vascular Dorsalis pedis and posterior tibial pulses 2/4 bilat. Brisk capillary refill to all digits. Pedal hair present.  Neurologic Epicritic sensation grossly reduced.  Dermatologic Dressing clean dry and intact  Orthopedic: MMT 5/5 in dorsiflexion, plantarflexion, inversion, and eversion. Normal joint ROM without pain or crepitus.   Results for orders placed or performed during the hospital encounter of 02/13/23  Surgical pcr screen     Status: None   Collection Time: 02/14/23 10:53 PM   Specimen: Nasal Mucosa; Nasal Swab  Result Value Ref Range Status   MRSA, PCR NEGATIVE NEGATIVE Final   Staphylococcus aureus NEGATIVE NEGATIVE Final    Comment: (NOTE) The Xpert SA Assay (FDA approved for NASAL specimens in patients 56 years of age and older), is one component of a comprehensive surveillance program. It is not intended to diagnose infection nor to guide or monitor treatment. Performed at [facility], [address], [place], [place] [address]   Aerobic/Anaerobic Culture w Gram Stain (surgical/deep wound)     Status: None (Preliminary result)   Collection Time: 02/15/23  8:10 AM   Specimen: PATH Gross Only; Tissue  Result Value Ref Range Status   Specimen Description OTHER  Final   Special Requests left foot explanted hardware  Final   Gram Stain RARE WBC SEEN RARE GRAM POSITIVE COCCI   Final   Culture   Final    CULTURE REINCUBATED FOR BETTER GROWTH Performed at [facility], [address] [place], [place]  [address]    Report Status PENDING  Incomplete  Aerobic/Anaerobic Culture w Gram Stain (surgical/deep wound)     Status: None (Preliminary result)   Collection Time: 02/15/23  8:13 AM   Specimen: PATH Bone biopsy; Tissue  Result Value Ref Range Status   Specimen Description TISSUE  Final   Special Requests fifth metatarsal base  Final   Gram Stain   Final    RARE WBC SEEN FEW GRAM POSITIVE COCCI RARE GRAM NEGATIVE RODS    Culture   Final    CULTURE REINCUBATED FOR BETTER GROWTH Performed at [facility], [address], [place], [place] [address]    Report Status PENDING  Incomplete  Aerobic/Anaerobic Culture w Gram Stain (surgical/deep wound)     Status: None (Preliminary result)   Collection Time: 02/15/23  8:18 AM   Specimen: PATH Bone biopsy; Tissue  Result Value Ref Range Status   Specimen Description TISSUE  Final   Special Requests fifth metatrsal non union  Final   Gram Stain RARE GRAM NEGATIVE RODS FEW GRAM POSITIVE COCCI   Final   Culture   Final    NO GROWTH < 24 HOURS Performed at [facility], [address], [place], [place] [address]    Report Status PENDING  Incomplete     Assessment & Plan:  Patient was evaluated and treated and all questions answered.  Diabetic foot infection, infected hardware, osteomyelitis -Currently cultures from  all sites growing GPC's and GNR's including bone.  Path pending. -Discussed with him the need for further resection of the infected bone and likely of antibiotic cement or beads -Plan for OR on Tuesday with Dr. Annamary Rummage for washout and further debridement and antibiotic bead placement -NPO past midnight -We also discussed the tendon transfer of the peroneus brevis that will be necessary this may not be able to be possible at this time and would occur at a later surgery date this admission or in 6 weeks after IV antibiotics -I do expect he will need a PICC line and IV antibiotic treatment -Has not had BM since  admission. Senna ordered, escalate as needed for him  Edwin Cap, DPM  Accessible via secure chat for questions or concerns.

## 2023-02-17 NOTE — Assessment & Plan Note (Addendum)
New onset diabetes, random glucose greater than 300 with polyuria and polydipsia.  A1C 9.0. CBGs in 100-200s overnight. Given procedure with podiatry planned for tomorrow, will continue SSI today and consider long acting insulin post op.  - moderate SSI - CBGs QID - TOC consult for assistance with finding a PCP

## 2023-02-17 NOTE — Plan of Care (Signed)
  Problem: Education: Goal: Knowledge of General Education information will improve Description: Including pain rating scale, medication(s)/side effects and non-pharmacologic comfort measures Outcome: Progressing   Problem: Clinical Measurements: Goal: Ability to maintain clinical measurements within normal limits will improve Outcome: Progressing   Problem: Activity: Goal: Risk for activity intolerance will decrease Outcome: Progressing   Problem: Nutrition: Goal: Adequate nutrition will be maintained Outcome: Progressing   Problem: Coping: Goal: Level of anxiety will decrease Outcome: Progressing   Problem: Elimination: Goal: Will not experience complications related to bowel motility Outcome: Progressing Goal: Will not experience complications related to urinary retention Outcome: Progressing   Problem: Pain Management: Goal: General experience of comfort will improve Outcome: Progressing   Problem: Safety: Goal: Ability to remain free from injury will improve Outcome: Progressing   Problem: Skin Integrity: Goal: Risk for impaired skin integrity will decrease Outcome: Progressing

## 2023-02-17 NOTE — Assessment & Plan Note (Addendum)
Chronic osteomyelitis and left foot after complications from surgery to fix left foot fracture in August. S/p bone biopsies and hardware removal on 11/9. Pain well-controlled at this time. Podiatry and ID on board, greatly appreciate their recommendations.  - Per podiatry, plan for further washout and debridement tomorrow.  - ID began Cefepime 2g q8 - PICC line placed - Gabapentin 400 TID - Flexeril 5 TID - Tylenol 650 mg q6h  - Oxycodone 5 q4 PRN for severe pain  - PT on board

## 2023-02-17 NOTE — Progress Notes (Signed)
Daily Progress Note Intern Pager: [phone]  Patient name: Tyler Rangel Medical record number: [id number] Date of birth: 03-13-1961 Age: 62 y.o. Gender: male  Primary Care Provider: Jerrye Bushy, FNP Consultants: Podiatry, ID Code Status: Full   Pt Overview and Major Events to Date:  11/7: Admitted to FMTS 11/9: Bone biopsy/hardware removal of left foot  Assessment and Plan:  Tyler Rangel is a 62 year old male PMH of HTN admitted for chronic osteomyelitis secondary to left foot surgery several months ago, now s/p bone biopsy and hardware removal. Pain overall well-controlled. Podiatry planning for further washout and debridement tomorrow.  Assessment & Plan Osteomyelitis (HCC) Chronic osteomyelitis and left foot after complications from surgery to fix left foot fracture in August. S/p bone biopsies and hardware removal on 11/9. Pain well-controlled at this time. Podiatry and ID on board, greatly appreciate their recommendations.  - Per podiatry, plan for further washout and debridement tomorrow.  - ID began Cefepime 2g q8 - PICC line placed - Gabapentin 400 TID - Flexeril 5 TID - Tylenol 650 mg q6h  - Oxycodone 5 q4 PRN for severe pain  - PT on board  Diabetes mellitus, new onset (HCC) New onset diabetes, random glucose greater than 300 with polyuria and polydipsia.  A1C 9.0. CBGs in 100-200s overnight. Given procedure with podiatry planned for tomorrow, will continue SSI today and consider long acting insulin post op.  - moderate SSI - CBGs QID - TOC consult for assistance with finding a PCP Hypertriglyceridemia Triglycerides 375, HDL 27. LDL 50. ASCVD 27.8%. Pt amenable to starting statin in hospital. - Continue atorvastatin 40 mg daily   Chronic and Stable Issues: HTN: continue home hydrochlorothiazide 12.5 daily, replete K as needed  GERD: continue protonix 80 mg daily (formulary equivalent of home nexium)  Constipation: Senna, miralax   FEN/GI: Carb modified diet,  NPO at MN PPx: Lovenox (held for tomorrow) Dispo: Home pending clinical improvement   Subjective:  Patient feeling alright this morning. L foot pain well-controlled with meds, with episodic pain at times. Has been able to move around his room. No CP/SOB. Normal PO intake, though has not had a BM in a few days.   Objective: Temp:  [97.7 F (36.5 C)-98.7 F (37.1 C)] 98 F (36.7 C) (11/11 0534) Pulse Rate:  [54-61] 54 (11/11 0534) Resp:  [16-19] 19 (11/11 0534) BP: (125-133)/(78-86) 125/80 (11/11 0534) SpO2:  [97 %-99 %] 99 % (11/11 0534) Physical Exam: General: Well-appearing. No acute distress. Cardiovascular: Normal S1/S2. No extra heart sounds. Warm and well-perfused.  Respiratory: Breathing comfortably on RA. CTAB. No increased WOB. Abdomen: Soft, nontender, nondistended. Extremities: LLE appropriately wrapped. LLE warm with normal sensation and movement. No obvious bleeding or drainage.   Laboratory: Most recent CBC Lab Results  Component Value Date   WBC 6.7 02/16/2023   HGB 13.5 02/16/2023   HCT 37.0 (L) 02/16/2023   MCV 86.0 02/16/2023   PLT 183 02/16/2023   Most recent BMP    Latest Ref Rng & Units 02/16/2023    5:45 AM  BMP  Glucose 70 - 99 mg/dL 737   BUN 8 - 23 mg/dL 22   Creatinine 1.06 - 1.24 mg/dL 2.69   Sodium 485 - 462 mmol/L 136   Potassium 3.5 - 5.1 mmol/L 3.4   Chloride 98 - 111 mmol/L 102   CO2 22 - 32 mmol/L 25   Calcium 8.9 - 10.3 mg/dL 8.8    Bone biopsy and wound cultures: GPC and GNR  at 2 days   Ivery Quale, MD 02/17/2023, 7:18 AM  PGY-1, [facility] Family Medicine FPTS Intern pager: [phone], text pages welcome Secure chat group [facility] Teaching Service

## 2023-02-17 NOTE — Progress Notes (Signed)
Peripherally Inserted Central Catheter Placement  The IV Nurse has discussed with the patient and/or persons authorized to consent for the patient, the purpose of this procedure and the potential benefits and risks involved with this procedure.  The benefits include less needle sticks, lab draws from the catheter, and the patient may be discharged home with the catheter. Risks include, but not limited to, infection, bleeding, blood clot (thrombus formation), and puncture of an artery; nerve damage and irregular heartbeat and possibility to perform a PICC exchange if needed/ordered by physician.  Alternatives to this procedure were also discussed.  Bard Power PICC patient education guide, fact sheet on infection prevention and patient information card has been provided to patient /or left at bedside.    PICC Placement Documentation  PICC Single Lumen 02/17/23 Right Cephalic 38 cm 1 cm (Active)  Indication for Insertion or Continuance of Line Prolonged intravenous therapies 02/17/23 0908  Exposed Catheter (cm) 1 cm 02/17/23 0908  Site Assessment Clean, Dry, Intact 02/17/23 0908  Line Status Flushed;Blood return noted;Saline locked 02/17/23 0908  Dressing Type Transparent 02/17/23 0908  Dressing Status Antimicrobial disc in place 02/17/23 0908  Line Care Connections checked and tightened 02/17/23 0908  Line Adjustment (NICU/IV Team Only) No 02/17/23 0908  Dressing Intervention New dressing 02/17/23 0908  Dressing Change Due 02/24/23 02/17/23 0908       Tyler Rangel 02/17/2023, 9:11 AM

## 2023-02-17 NOTE — Assessment & Plan Note (Addendum)
Triglycerides 375, HDL 27. LDL 50. ASCVD 27.8%. Pt amenable to starting statin in hospital. - Continue atorvastatin 40 mg daily

## 2023-02-17 NOTE — Plan of Care (Signed)
  Problem: Education: Goal: Ability to describe self-care measures that may prevent or decrease complications (Diabetes Survival Skills Education) will improve Outcome: Progressing   

## 2023-02-17 NOTE — Progress Notes (Signed)
ID brief note   Remains afebrile    Latest Ref Rng & Units 02/16/2023    5:45 AM 02/15/2023    5:42 AM 02/13/2023    2:54 PM  CBC  WBC 4.0 - 10.5 K/uL 6.7  6.8  7.2   Hemoglobin 13.0 - 17.0 g/dL 16.1  09.6  04.5   Hematocrit 39.0 - 52.0 % 37.0  38.2  43.3   Platelets 150 - 400 K/uL 183  199  246       Latest Ref Rng & Units 02/17/2023    6:31 AM 02/16/2023    5:45 AM 02/15/2023    5:42 AM  CMP  Glucose 70 - 99 mg/dL 409  811  914   BUN 8 - 23 mg/dL 16  22  20    Creatinine 0.61 - 1.24 mg/dL 7.82  9.56  2.13   Sodium 135 - 145 mmol/L 136  136  134   Potassium 3.5 - 5.1 mmol/L 3.4  3.4  3.4   Chloride 98 - 111 mmol/L 102  102  100   CO2 22 - 32 mmol/L 27  25  24    Calcium 8.9 - 10.3 mg/dL 8.8  8.8  8.7    Results for orders placed or performed during the hospital encounter of 02/13/23  Surgical pcr screen     Status: None   Collection Time: 02/14/23 10:53 PM   Specimen: Nasal Mucosa; Nasal Swab  Result Value Ref Range Status   MRSA, PCR NEGATIVE NEGATIVE Final   Staphylococcus aureus NEGATIVE NEGATIVE Final    Comment: (NOTE) The Xpert SA Assay (FDA approved for NASAL specimens in patients 89 years of age and older), is one component of a comprehensive surveillance program. It is not intended to diagnose infection nor to guide or monitor treatment. Performed at [facility], [address], [place], [place] [address]   Aerobic/Anaerobic Culture w Gram Stain (surgical/deep wound)     Status: None (Preliminary result)   Collection Time: 02/15/23  8:10 AM   Specimen: PATH Gross Only; Tissue  Result Value Ref Range Status   Specimen Description OTHER  Final   Special Requests left foot explanted hardware  Final   Gram Stain RARE WBC SEEN RARE GRAM POSITIVE COCCI   Final   Culture   Final    CULTURE REINCUBATED FOR BETTER GROWTH Performed at [facility], [address], [place], [place] [address]    Report Status PENDING  Incomplete  Aerobic/Anaerobic  Culture w Gram Stain (surgical/deep wound)     Status: None (Preliminary result)   Collection Time: 02/15/23  8:13 AM   Specimen: PATH Bone biopsy; Tissue  Result Value Ref Range Status   Specimen Description TISSUE  Final   Special Requests fifth metatarsal base  Final   Gram Stain   Final    RARE WBC SEEN FEW GRAM POSITIVE COCCI RARE GRAM NEGATIVE RODS    Culture   Final    CULTURE REINCUBATED FOR BETTER GROWTH Performed at [facility], [address] [place], [place] [address]    Report Status PENDING  Incomplete  Aerobic/Anaerobic Culture w Gram Stain (surgical/deep wound)     Status: None (Preliminary result)   Collection Time: 02/15/23  8:18 AM   Specimen: PATH Bone biopsy; Tissue  Result Value Ref Range Status   Specimen Description TISSUE  Final   Special Requests fifth metatrsal non union  Final   Gram Stain RARE GRAM NEGATIVE RODS  FEW GRAM POSITIVE COCCI   Final   Culture   Final    NO GROWTH 2 DAYS Performed at [facility], [address] [place], [place] [address]    Report Status PENDING  Incomplete   - Will switch IV abtx to IV cefepime only to cover GPC and GNR seen in cultures. GPC is likely MSSA which was previously present. Fu OR cx to completion. Fu path  - Plan for further debridement per podiatry noted  Odette Fraction, MD Infectious Disease Physician [facility] [address] [place], [place] [address] Phone: [phone]  Fax: [phone]

## 2023-02-17 NOTE — Progress Notes (Signed)
Mobility Specialist: Progress Note   02/17/23 1527  Mobility  Activity Ambulated with assistance in room  Level of Assistance Standby assist, set-up cues, supervision of patient - no hands on  Assistive Device Front wheel walker  Distance Ambulated (ft) 60 ft  LLE Weight Bearing NWB  Activity Response Tolerated well  Mobility Referral Yes  $Mobility charge 1 Mobility  Mobility Specialist Start Time (ACUTE ONLY) 1413  Mobility Specialist Stop Time (ACUTE ONLY) 1435  Mobility Specialist Time Calculation (min) (ACUTE ONLY) 22 min    Pt was agreeable to mobility session - received in bed. ModI for bed mobility, close SV for STS and ambulation. 1x LOB during STS but able to catch himself and sit back down. Ambulated 2 laps around room without fault. Left in bed with all needs met, call bell in reach.   Maurene Capes Mobility Specialist Please contact via SecureChat or Rehab office at [phone]

## 2023-02-18 ENCOUNTER — Inpatient Hospital Stay (HOSPITAL_COMMUNITY): Payer: 59 | Admitting: Anesthesiology

## 2023-02-18 ENCOUNTER — Inpatient Hospital Stay (HOSPITAL_COMMUNITY): Payer: 59

## 2023-02-18 ENCOUNTER — Other Ambulatory Visit: Payer: Self-pay

## 2023-02-18 ENCOUNTER — Encounter (HOSPITAL_COMMUNITY): Payer: Self-pay | Admitting: Student

## 2023-02-18 ENCOUNTER — Encounter (HOSPITAL_COMMUNITY)
Admission: EM | Disposition: A | Discharge status: Home Health | Payer: Self-pay | Source: Home / Self Care | Attending: Family Medicine

## 2023-02-18 DIAGNOSIS — M869 Osteomyelitis, unspecified: Secondary | ICD-10-CM | POA: Diagnosis not present

## 2023-02-18 DIAGNOSIS — L089 Local infection of the skin and subcutaneous tissue, unspecified: Secondary | ICD-10-CM | POA: Diagnosis not present

## 2023-02-18 DIAGNOSIS — M86472 Chronic osteomyelitis with draining sinus, left ankle and foot: Secondary | ICD-10-CM | POA: Diagnosis not present

## 2023-02-18 DIAGNOSIS — E1169 Type 2 diabetes mellitus with other specified complication: Secondary | ICD-10-CM

## 2023-02-18 HISTORY — PX: AMPUTATION: SHX166

## 2023-02-18 LAB — BASIC METABOLIC PANEL
Anion gap: 8 (ref 5–15)
BUN: 15 mg/dL (ref 8–23)
CO2: 26 mmol/L (ref 22–32)
Calcium: 9.1 mg/dL (ref 8.9–10.3)
Chloride: 104 mmol/L (ref 98–111)
Creatinine, Ser: 1.02 mg/dL (ref 0.61–1.24)
GFR, Estimated: >60 mL/min (ref >60)
Glucose, Bld: 182 mg/dL — ABNORMAL HIGH (ref 70–99)
Potassium: 3.7 mmol/L (ref 3.5–5.1)
Sodium: 138 mmol/L (ref 135–145)

## 2023-02-18 LAB — GLUCOSE, CAPILLARY
Glucose-Capillary: 165 mg/dL — ABNORMAL HIGH (ref 70–99)
Glucose-Capillary: 163 mg/dL — ABNORMAL HIGH (ref 70–99)
Glucose-Capillary: 126 mg/dL — ABNORMAL HIGH (ref 70–99)
Glucose-Capillary: 133 mg/dL — ABNORMAL HIGH (ref 70–99)
Glucose-Capillary: 176 mg/dL — ABNORMAL HIGH (ref 70–99)

## 2023-02-18 LAB — SURGICAL PATHOLOGY

## 2023-02-18 SURGERY — AMPUTATION, FOOT, RAY
Anesthesia: Monitor Anesthesia Care | Laterality: Left

## 2023-02-18 MED ORDER — ORAL CARE MOUTH RINSE: 15.0000 mL | Freq: Once | OROMUCOSAL | Status: AC

## 2023-02-18 MED ORDER — LIDOCAINE HCL 1 % IJ SOLN
INTRAMUSCULAR | Status: AC
  Filled 2023-02-18: qty 20

## 2023-02-18 MED ORDER — DAPTOMYCIN-SODIUM CHLORIDE 700-0.9 MG/100ML-% IV SOLN
8.0000 mg/kg | Freq: Every day | INTRAVENOUS | Status: DC
  Administered 2023-02-18 – 2023-02-24 (×7): 700 mg via INTRAVENOUS
  Filled 2023-02-18 (×8): qty 100

## 2023-02-18 MED ORDER — OXYCODONE HCL 5 MG PO TABS: 5.0000 mg | ORAL_TABLET | Freq: Once | ORAL | Status: DC | PRN

## 2023-02-18 MED ORDER — TOBRAMYCIN SULFATE 1.2 G IJ SOLR
INTRAMUSCULAR | Status: AC
  Filled 2023-02-18: qty 2.4

## 2023-02-18 MED ORDER — TOBRAMYCIN SULFATE 80 MG/2ML IJ SOLN
INTRAMUSCULAR | Status: DC | PRN
  Administered 2023-02-18: 120 mg via INTRAMUSCULAR

## 2023-02-18 MED ORDER — CHLORHEXIDINE GLUCONATE 0.12 % MT SOLN
15.0000 mL | Freq: Once | OROMUCOSAL | Status: AC
Start: 2023-02-18 — End: 2023-02-18
  Administered 2023-02-18: 15 mL via OROMUCOSAL
  Filled 2023-02-18: qty 15

## 2023-02-18 MED ORDER — TOBRAMYCIN SULFATE 80 MG/2ML IJ SOLN
INTRAMUSCULAR | Status: AC
  Filled 2023-02-18: qty 4

## 2023-02-18 MED ORDER — POLYETHYLENE GLYCOL 3350 17 G PO PACK
17.0000 g | PACK | Freq: Two times a day (BID) | ORAL | Status: AC
  Administered 2023-02-18 (×2): 17 g via ORAL
  Filled 2023-02-18 (×2): qty 1

## 2023-02-18 MED ORDER — LACTATED RINGERS IV SOLN
INTRAVENOUS | Status: DC
Start: 2023-02-18 — End: 2023-02-18

## 2023-02-18 MED ORDER — BUPIVACAINE HCL (PF) 0.5 % IJ SOLN
INTRAMUSCULAR | Status: AC
  Filled 2023-02-18: qty 30

## 2023-02-18 MED ORDER — FENTANYL CITRATE (PF) 100 MCG/2ML IJ SOLN
INTRAMUSCULAR | Status: AC
  Filled 2023-02-18: qty 2

## 2023-02-18 MED ORDER — AMISULPRIDE (ANTIEMETIC) 5 MG/2ML IV SOLN: 10.0000 mg | Freq: Once | INTRAVENOUS | Status: DC | PRN

## 2023-02-18 MED ORDER — LACTATED RINGERS IV SOLN: INTRAVENOUS | Status: DC | PRN

## 2023-02-18 MED ORDER — PROPOFOL 500 MG/50ML IV EMUL
INTRAVENOUS | Status: DC | PRN
  Administered 2023-02-18: 150 ug/kg/min via INTRAVENOUS

## 2023-02-18 MED ORDER — OXYCODONE HCL 5 MG/5ML PO SOLN: 5.0000 mg | Freq: Once | ORAL | Status: DC | PRN

## 2023-02-18 MED ORDER — ACETAMINOPHEN 10 MG/ML IV SOLN: 1000.0000 mg | Freq: Once | INTRAVENOUS | Status: DC | PRN

## 2023-02-18 MED ORDER — INSULIN ASPART 100 UNIT/ML IJ SOLN
0.0000 [IU] | INTRAMUSCULAR | Status: DC | PRN
  Administered 2023-02-18: 2 [IU] via SUBCUTANEOUS

## 2023-02-18 MED ORDER — BUPIVACAINE HCL (PF) 0.5 % IJ SOLN
INTRAMUSCULAR | Status: DC | PRN
  Administered 2023-02-18: 20 mL

## 2023-02-18 MED ORDER — VANCOMYCIN HCL 1000 MG IV SOLR
INTRAVENOUS | Status: AC
  Filled 2023-02-18: qty 20

## 2023-02-18 MED ORDER — FENTANYL CITRATE (PF) 100 MCG/2ML IJ SOLN
25.0000 ug | INTRAMUSCULAR | Status: DC | PRN
  Administered 2023-02-18 (×2): 50 ug via INTRAVENOUS

## 2023-02-18 SURGICAL SUPPLY — 45 items
APL PRP STRL LF DISP 70% ISPRP (MISCELLANEOUS)
BLADE OSC/SAGITTAL MD 5.5X18 (BLADE) ×1 IMPLANT
BLADE SURG 10 STRL SS (BLADE) ×1 IMPLANT
BLADE SURG 15 STRL LF DISP TIS (BLADE) ×2 IMPLANT
BLADE SURG 15 STRL SS (BLADE) ×2
BNDG CMPR 5X3 CHSV STRCH STRL (GAUZE/BANDAGES/DRESSINGS)
BNDG CMPR 5X3 KNIT ELC UNQ LF (GAUZE/BANDAGES/DRESSINGS)
BNDG CMPR 5X4 KNIT ELC UNQ LF (GAUZE/BANDAGES/DRESSINGS) ×1
BNDG CMPR 75X21 PLY HI ABS (MISCELLANEOUS) ×1
BNDG CMPR 9X4 STRL LF SNTH (GAUZE/BANDAGES/DRESSINGS) ×1
BNDG COHESIVE 3X5 TAN ST LF (GAUZE/BANDAGES/DRESSINGS) ×1 IMPLANT
BNDG ELASTIC 3INX 5YD STR LF (GAUZE/BANDAGES/DRESSINGS) ×1 IMPLANT
BNDG ELASTIC 4INX 5YD STR LF (GAUZE/BANDAGES/DRESSINGS) IMPLANT
BNDG ESMARK 4X9 LF (GAUZE/BANDAGES/DRESSINGS) ×1 IMPLANT
BNDG GAUZE DERMACEA FLUFF 4 (GAUZE/BANDAGES/DRESSINGS) IMPLANT
BNDG GZE DERMACEA 4 6PLY (GAUZE/BANDAGES/DRESSINGS) ×1
CHLORAPREP W/TINT 26 (MISCELLANEOUS) ×1 IMPLANT
DRSG EMULSION OIL 3X3 NADH (GAUZE/BANDAGES/DRESSINGS) IMPLANT
ELECT REM PT RETURN 9FT ADLT (ELECTROSURGICAL) ×1
ELECTRODE REM PT RTRN 9FT ADLT (ELECTROSURGICAL) ×1 IMPLANT
GAUZE PAD ABD 8X10 STRL (GAUZE/BANDAGES/DRESSINGS) IMPLANT
GAUZE SPONGE 2X2 STRL 8-PLY (GAUZE/BANDAGES/DRESSINGS) ×2 IMPLANT
GAUZE SPONGE 4X4 12PLY STRL (GAUZE/BANDAGES/DRESSINGS) ×1 IMPLANT
GAUZE STRETCH 2X75IN STRL (MISCELLANEOUS) ×1 IMPLANT
GAUZE XEROFORM 1X8 LF (GAUZE/BANDAGES/DRESSINGS) ×1 IMPLANT
GLOVE BIO SURGEON STRL SZ7.5 (GLOVE) ×1 IMPLANT
GLOVE BIOGEL PI IND STRL 7.5 (GLOVE) ×1 IMPLANT
GOWN STRL REUS W/ TWL LRG LVL3 (GOWN DISPOSABLE) ×2 IMPLANT
GOWN STRL REUS W/TWL LRG LVL3 (GOWN DISPOSABLE) ×2
KIT BASIN OR (CUSTOM PROCEDURE TRAY) ×1 IMPLANT
KIT STIMULAN RAPID CURE 5CC (Orthopedic Implant) IMPLANT
NDL BIOPSY JAMSHIDI 8X6 (NEEDLE) IMPLANT
NDL HYPO 25X1 1.5 SAFETY (NEEDLE) ×2 IMPLANT
NEEDLE BIOPSY JAMSHIDI 8X6 (NEEDLE) IMPLANT
NEEDLE HYPO 25X1 1.5 SAFETY (NEEDLE) IMPLANT
PACK ORTHO EXTREMITY (CUSTOM PROCEDURE TRAY) ×1 IMPLANT
PADDING CAST ABS COTTON 4X4 ST (CAST SUPPLIES) ×2 IMPLANT
SPIKE FLUID TRANSFER (MISCELLANEOUS) ×2 IMPLANT
STAPLER VISISTAT 35W (STAPLE) IMPLANT
STOCKINETTE 4X48 STRL (DRAPES) ×1 IMPLANT
SUT ETHILON 3 0 FSLX (SUTURE) ×1 IMPLANT
SUT PROLENE 4 0 PS 2 18 (SUTURE) ×2 IMPLANT
SYR CONTROL 10ML LL (SYRINGE) ×2 IMPLANT
UNDERPAD 30X36 HEAVY ABSORB (UNDERPADS AND DIAPERS) ×1 IMPLANT
WATER STERILE IRR 1000ML POUR (IV SOLUTION) ×1 IMPLANT

## 2023-02-18 NOTE — Progress Notes (Signed)
Daily Progress Note Intern Pager: [phone]  Patient name: Tyler Rangel Medical record number: [id number] Date of birth: 09-22-1960 Age: 62 y.o. Gender: male  Primary Care Provider: Jerrye Bushy, FNP Consultants: Podiatry, ID Code Status: Full    Pt Overview and Major Events to Date:  11/7: Admitted to FMTS 11/9: Bone biopsy/hardware removal of left foot   Assessment and Plan:   Hussien Biba is a 62 year old male PMH of HTN admitted for chronic osteomyelitis secondary to left foot surgery several months ago, now s/p bone biopsy and hardware removal. Pain overall well-controlled. Podiatry planning for further washout and debridement today.  Assessment & Plan Osteomyelitis (HCC) Chronic osteomyelitis and left foot after complications from surgery to fix left foot fracture in August. S/p bone biopsies and hardware removal on 11/9. Pain well-controlled at this time. Podiatry and ID on board, greatly appreciate their recommendations.  - Podiatry planning for washout and debridement today. Postop check to follow.  - Per ID, Cefepime 2g q8 IV to cover GPC and GNR seen in cultures. GPC is likely MSSA which was previously present. Fu OR cx to completion. Fu path.  - PICC line placed 11/11 - Gabapentin 400 TID - Flexeril 5 TID - Tylenol 650 mg q6h  - Oxycodone 5 q4 PRN for severe pain  - PT on board  Diabetes mellitus, new onset (HCC) New onset diabetes, random glucose greater than 300 with polyuria and polydipsia.  A1C 9.0. CBGs in 100-200s overnight. Given procedure with podiatry planned for today, will continue SSI today and consider long acting insulin post op.  - moderate SSI - CBGs QID - TOC consult for assistance with finding a PCP Hypertriglyceridemia Triglycerides 375, HDL 27. LDL 50. ASCVD 27.8%. Started statin in hospital. - Continue atorvastatin 40 mg daily    Chronic and Stable Issues: HTN: continue home hydrochlorothiazide 12.5 daily, replete K as needed  GERD:  continue protonix 80 mg daily (formulary equivalent of home nexium)  Constipation: Scheduled Miralax BID and Senna BID, consider suppository as needed.    FEN/GI: NPO for podiatry procedure today PPx: Held for procedure today Dispo: Home pending clinical improvement   Subjective:  No acute events overnight. Pain well-controlled with medications at this time. Patient still has not had a BM since 11/7. No abdominal pain, no vomiting.   Objective: Temp:  [97.6 F (36.4 C)-98.5 F (36.9 C)] 97.6 F (36.4 C) (11/12 0635) Pulse Rate:  [55-69] 55 (11/12 0635) Resp:  [17-18] 18 (11/12 0635) BP: (123-147)/(71-91) 136/71 (11/12 0635) SpO2:  [96 %-98 %] 98 % (11/12 1478) Physical Exam: General: Well-appearing. No acute distress.  Cardiovascular: Normal S1/S2. No extra heart sounds. Warm and well-perfused.  Respiratory: Breathing comfortably on RA. CTAB. No increased WOB. Abdomen: Soft, nontender, nondistended. Extremities: Well perfused LLE, cap refill <2 sec. Appropriately wrapped, no visible drainage.   Laboratory: Most recent CBC Lab Results  Component Value Date   WBC 6.7 02/16/2023   HGB 13.5 02/16/2023   HCT 37.0 (L) 02/16/2023   MCV 86.0 02/16/2023   PLT 183 02/16/2023   Most recent BMP    Latest Ref Rng & Units 02/18/2023    3:55 AM  BMP  Glucose 70 - 99 mg/dL 295   BUN 8 - 23 mg/dL 15   Creatinine 6.21 - 1.24 mg/dL 3.08   Sodium 657 - 846 mmol/L 138   Potassium 3.5 - 5.1 mmol/L 3.7   Chloride 98 - 111 mmol/L 104   CO2 22 - 32  mmol/L 26   Calcium 8.9 - 10.3 mg/dL 9.1     Ivery Quale, MD 02/18/2023, 8:10 AM  PGY-1, [facility] Family Medicine FPTS Intern pager: [phone], text pages welcome Secure chat group [facility] Teaching Service

## 2023-02-18 NOTE — Transfer of Care (Signed)
Immediate Anesthesia Transfer of Care Note  Patient: Tyler Rangel  Procedure(s) Performed: Repeat irrigation and debridement, resection of 5th metatarsal, antibiotic beads (Left)  Patient Location: PACU  Anesthesia Type:MAC  Level of Consciousness: awake and alert   Airway & Oxygen Therapy: Patient Spontanous Breathing  Post-op Assessment: Report given to RN and Post -op Vital signs reviewed and stable  Post vital signs: Reviewed and stable  Last Vitals:  Vitals Value Taken Time  BP 132/85 02/18/23 1734  Temp    Pulse 75 02/18/23 1737  Resp 17 02/18/23 1737  SpO2 95 % 02/18/23 1737  Vitals shown include unfiled device data.  Last Pain:  Vitals:   02/18/23 1552  TempSrc:   PainSc: 0-No pain      Patients Stated Pain Goal: 2 (02/17/23 2112)  Complications: No notable events documented.

## 2023-02-18 NOTE — Progress Notes (Signed)
Pharmacy Antibiotic Note  Tyler Rangel is a 63 y.o. 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 S/p hardware removal and bone biopsy 11/9.  Underwent further I&D and 5th met base resection on 11/12. Pharmacy has been consulted for daptomycin dosing.  Plan: Daptomycin 700mg  IV q24 hours Cefepime 2g IV q8 hours per ID F/u culture data to narrow abx Monitor CK weekly on Wednesdays   Height: 5\' 6"  (167.6 cm) Weight: 82.1 kg (181 lb) IBW/kg (Calculated) : 63.8  Temp (24hrs), Avg:97.7 F (36.5 C), Min:97.6 F (36.4 C), Max:98 F (36.7 C)  Recent Labs  Lab 02/13/23 1454 02/13/23 1459 02/14/23 0615 02/15/23 0542 02/16/23 0545 02/17/23 0631 02/18/23 0355  WBC 7.2  --   --  6.8 6.7  --   --   CREATININE 1.05  --  1.02 1.05 1.12 1.08 1.02  LATICACIDVEN  --  1.5  --   --   --   --   --     Estimated Creatinine Clearance: 75.5 mL/min (by C-G formula based on SCr of 1.02 mg/dL).    Allergies  Allergen Reactions   Hydrocodone Nausea And Vomiting    Antimicrobials this admission: Cefepime 11/11 >>  Daptomycin 11/12 >>   Dose adjustments this admission:  Microbiology results: 11/9 L foot hardware: Staph epi 11/9 5th metatarsal bone: Staph caprae, GNR on gram stain 11/9 5th metatarsal nonunion: GNR, GPC  Thank you for allowing pharmacy to be a part of this patient's care.  Rexford Maus, PharmD, BCPS 02/18/2023 5:37 PM

## 2023-02-18 NOTE — Assessment & Plan Note (Signed)
New onset diabetes, random glucose greater than 300 with polyuria and polydipsia.  A1C 9.0. CBGs in 100-200s overnight. Given procedure with podiatry planned for today, will continue SSI today and consider long acting insulin post op.  - moderate SSI - CBGs QID - TOC consult for assistance with finding a PCP

## 2023-02-18 NOTE — Progress Notes (Signed)
[facility]  Date of Admission:  02/13/2023     Total days of antibiotics 3         ASSESSMENT:  Tyler Rangel is scheduled for debridement and washout with Podiatry in the setting of diabetic foot infection with osteomyelitis and infected hardware s/p hardware removal. Surgical specimens remain pending with one showing Staphylococcus Capre and have requested sensitivities from Microbiology with another being re incubated for better growth. Continue current dose of cefepime in the setting of previous MSSA. Monitor cultures and adjust antibiotics as appropriate. Discussed plan of care with additional recommendations pending surgical outcomes. Remaining medical and supportive care per [facility] Medicine Team.   PLAN:  Continue current dose of cefepime. Monitor cultures for additional organism identification and sensitivities Debridement and washout planned today with Podiatry.  Remaining medical and supportive care per [facility] Medicine.    I have personally spent  30 minutes involved in face-to-face and non-face-to-face activities for this patient on the day of the visit. Professional time spent includes the following activities: Preparing to see the patient (review of tests), Obtaining and/or reviewing separately obtained history (admission/discharge record), Performing a medically appropriate examination and/or evaluation , Ordering medications/tests/procedures, referring and communicating with other health care professionals, Documenting clinical information in the EMR, Independently interpreting results (not separately reported), Communicating results to the patient/family/caregiver, Counseling and educating the patient/family/caregiver and Care coordination (not separately reported).    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 Concern for constipation. Tolerating antibiotics with no adverse side effects.    Allergies  Allergen Reactions   Hydrocodone Nausea And Vomiting     Review of Systems: Review of Systems  Constitutional:  Negative for chills, fever and weight loss.  Respiratory:  Negative for cough, shortness of breath and wheezing.   Cardiovascular:  Negative for chest pain and leg swelling.  Gastrointestinal:  Positive for constipation. Negative for abdominal pain, diarrhea, nausea and vomiting.  Skin:  Negative for rash.      OBJECTIVE: Vitals:   02/17/23 1507 02/17/23 1952 02/18/23 0635 02/18/23 0824  BP: 123/87 (!) 147/91 136/71 132/77  Pulse: 64 69 (!) 55 (!) 55  Resp: 17 18 18 17   Temp: 98.5 F (36.9 C) 98 F (36.7 C) 97.6 F (36.4 C) 97.6 F (36.4 C)  TempSrc: Oral Oral Oral Oral  SpO2: 96% 98% 98% 96%  Weight:      Height:       Body mass index is 29.21 kg/m.  Physical Exam Constitutional:      General: He is not in acute distress.    Appearance: He is well-developed.  Cardiovascular:     Rate and  Rhythm: Normal rate and regular rhythm.     Heart sounds: Normal heart sounds.  Pulmonary:     Effort: Pulmonary effort is normal.     Breath sounds: Normal breath sounds.  Skin:    General: Skin is warm and dry.  Neurological:     Mental Status: He is alert.  Psychiatric:        Mood and Affect: Mood normal.     Lab  Results Lab Results  Component Value Date   WBC 6.7 02/16/2023   HGB 13.5 02/16/2023   HCT 37.0 (L) 02/16/2023   MCV 86.0 02/16/2023   PLT 183 02/16/2023    Lab Results  Component Value Date   CREATININE 1.02 02/18/2023   BUN 15 02/18/2023   NA 138 02/18/2023   K 3.7 02/18/2023   CL 104 02/18/2023   CO2 26 02/18/2023    Lab Results  Component Value Date   ALT 38 02/13/2023   AST 24 02/13/2023   ALKPHOS 116 02/13/2023   BILITOT 1.3 (H) 02/13/2023     Microbiology: Recent Results (from the past 240 hour(s))  Surgical pcr screen     Status: None   Collection Time: 02/14/23 10:53 PM   Specimen: Nasal Mucosa; Nasal Swab  Result Value Ref Range Status   MRSA, PCR NEGATIVE NEGATIVE Final   Staphylococcus aureus NEGATIVE NEGATIVE Final    Comment: (NOTE) The Xpert SA Assay (FDA approved for NASAL specimens in patients 55 years of age and older), is one component of a comprehensive surveillance program. It is not intended to diagnose infection nor to guide or monitor treatment. Performed at [facility], [address], [place], [place] [address]   Aerobic/Anaerobic Culture w Gram Stain (surgical/deep wound)     Status: None (Preliminary result)   Collection Time: 02/15/23  8:10 AM   Specimen: PATH Gross Only; Tissue  Result Value Ref Range Status   Specimen Description OTHER  Final   Special Requests left foot explanted hardware  Final   Gram Stain RARE WBC SEEN RARE GRAM POSITIVE COCCI   Final   Culture   Final    CULTURE REINCUBATED FOR BETTER GROWTH Performed at [facility], [address], [place], [place] [address]    Report Status PENDING  Incomplete  Aerobic/Anaerobic Culture w Gram Stain (surgical/deep wound)     Status: None (Preliminary result)   Collection Time: 02/15/23  8:13 AM   Specimen: PATH Bone biopsy; Tissue  Result Value Ref Range Status   Specimen Description TISSUE  Final   Special Requests fifth metatarsal base  Final   Gram  Stain   Final    RARE WBC SEEN FEW GRAM POSITIVE COCCI RARE GRAM NEGATIVE RODS Performed at [facility], [address], [place], [place] [address]    Culture   Final    RARE STAPHYLOCOCCUS CAPRAE CALL MICROBIOLOGY LAB IF SENSITIVITIES ARE REQUIRED. NO ANAEROBES ISOLATED; CULTURE IN PROGRESS FOR 5 DAYS    Report Status PENDING  Incomplete  Aerobic/Anaerobic Culture w Gram Stain (surgical/deep wound)     Status: None (Preliminary result)   Collection Time: 02/15/23  8:18 AM   Specimen: PATH Bone biopsy; Tissue  Result Value Ref Range Status   Specimen Description TISSUE  Final   Special Requests fifth metatrsal non union  Final   Gram Stain RARE GRAM NEGATIVE RODS FEW GRAM POSITIVE COCCI   Final   Culture   Final    NO GROWTH  3 DAYS NO ANAEROBES ISOLATED; CULTURE IN PROGRESS FOR 5 DAYS Performed at [facility], [address] [place], [place] [address]    Report Status PENDING  Incomplete     Marcos Eke, NP [facility]  02/18/2023  11:22 AM

## 2023-02-18 NOTE — Anesthesia Postprocedure Evaluation (Signed)
Anesthesia Post Note  Patient: Tyler Rangel  Procedure(s) Performed: Repeat irrigation and debridement, resection of 5th metatarsal, antibiotic beads (Left)     Anesthesia Type: MAC Anesthetic complications: no   No notable events documented.  Last Vitals:  Vitals:   02/18/23 1815 02/18/23 1834  BP: (!) 148/99 (!) 163/94  Pulse: (!) 57 (!) 57  Resp: 15 17  Temp: 36.6 C   SpO2: 95% 100%    Last Pain:  Vitals:   02/18/23 1815  TempSrc:   PainSc: Asleep                 Linton Rump

## 2023-02-18 NOTE — Op Note (Signed)
Full Operative Report  Date of Operation: 5:41 PM, 02/18/2023   Patient: Tyler Rangel - 62 y.o. male  Surgeon: Pilar Plate, DPM   Assistant: None  Diagnosis: Osteomeylitis left foot  Procedure:  1. ***    Anesthesia: Monitor Anesthesia Care  No responsible provider has been recorded for the case.  Anesthesiologist: Linton Rump, MD CRNA: Sandie Ano, CRNA   Estimated Blood Loss: Minimal ***  Hemostasis: 1) Anatomical dissection, mechanical compression, electrocautery 2) ***  Implants: * No implants in log *  Materials: ***  Injectables: 1) Pre-operatively: *** cc of 50:50 mixture 1%lidocaine plain and 0.5% marcaine plain 2) Post-operatively: None ***  Specimens: Pathology: ***  Microbiology: ***   Antibiotics: IV antibiotics given per schedule on the floor  Drains: None  Complications: Patient tolerated the procedure well without complication.   Operative findings: As below in detailed report  Indications for Procedure: Vaden Matlick presents to Pilar Plate, DPM with a chief complaint of *** The patient has failed conservative treatments of various modalities. At this time the patient has elected to proceed with surgical correction. All alternatives, risks, and complications of the procedures were thoroughly explained to the patient. Patient exhibits appropriate understanding of all discussion points and informed consent was signed and obtained in the chart with no guarantees to surgical outcome given or implied.  Description of Procedure: Patient was brought to the operating room. Patient remained on their hospital bed in the supine position. A surgical timeout was performed and all members of the operating room, the procedure, and the surgical site were identified. anesthesia occurred as per anesthesia record. Local anesthetic as previously described was then injected about the operative field in a local infiltrative block.   The operative lower extremity as noted above was then prepped and draped in the usual sterile manner. The following procedure then began.  ***  The surgical site was then dressed with ***. The patient tolerated both the procedure and anesthesia well with vital signs stable throughout. The patient was transferred in good condition and all vital signs stable  from the OR to recovery under the discretion of anesthesia.  Condition: Vital signs stable, neurovascular status unchanged from preoperative   Surgical plan:  ***  The patient will be *** in a *** to the operative limb until further instructed. The dressing is to remain clean, dry, and intact. Will continue to follow unless noted elsewhere.   Carlena Hurl, DPM [facility]

## 2023-02-18 NOTE — Plan of Care (Signed)

## 2023-02-18 NOTE — Assessment & Plan Note (Signed)
Chronic osteomyelitis and left foot after complications from surgery to fix left foot fracture in August. S/p bone biopsies and hardware removal on 11/9. Pain well-controlled at this time. Podiatry and ID on board, greatly appreciate their recommendations.  - Podiatry planning for washout and debridement today. Postop check to follow.  - Per ID, Cefepime 2g q8 IV to cover GPC and GNR seen in cultures. GPC is likely MSSA which was previously present. Fu OR cx to completion. Fu path.  - PICC line placed 11/11 - Gabapentin 400 TID - Flexeril 5 TID - Tylenol 650 mg q6h  - Oxycodone 5 q4 PRN for severe pain  - PT on board

## 2023-02-18 NOTE — Progress Notes (Signed)
Mobility Specialist Progress Note:   02/18/23 1205  Mobility  Activity Ambulated independently to bathroom;Ambulated with assistance in room  Level of Assistance Standby assist, set-up cues, supervision of patient - no hands on  Assistive Device Front wheel walker  Distance Ambulated (ft) 50 ft  LLE Weight Bearing NWB  Activity Response Tolerated well  Mobility Referral Yes  $Mobility charge 1 Mobility  Mobility Specialist Start Time (ACUTE ONLY) 1200  Mobility Specialist Stop Time (ACUTE ONLY) 1210  Mobility Specialist Time Calculation (min) (ACUTE ONLY) 10 min   Pt received in bathroom. Successful BM. Pt not requiring any physical assistance to ambulate. Maintained NWB status on LLE. No c/o throughout. Pt left in chair with all needs met.   Addison Lank Mobility Specialist Please contact via SecureChat or  Rehab office at [phone]

## 2023-02-18 NOTE — TOC Initial Note (Addendum)
Transition of Care [facility]) - Initial/Assessment Note    Patient Details  Name: Tyler Rangel MRN: [id number] Date of Birth: 11-05-1960  Transition of Care [facility]) CM/SW Contact:    Epifanio Lesches, RN Phone Number: 02/18/2023, 11:20 AM  Clinical Narrative:                 Presents with L foot pain/chronic osteomyelitis. From home with wife . PTA independent with ADL's.       -s/p removal of deep hardware and open biopsy, 11/9    Plan further surgery 11/13.  ID following. Cultures pending. Potential LT IV ABX need. Pt agreeable to home infusion therapy if needed. Pt without provider preference. Referral made with Pam/ [facility].for [facility] and antibiotic therapy, acceptance pending.  TOC team following and will assist with needs...   Patient Goals and CMS Choice     Choice offered to / list presented to : Patient      Expected Discharge Plan and Services   Discharge Planning Services: CM Consult                     DME Arranged: Other see comment (IV ABX therapy)   Date DME Agency Contacted: 02/18/23 Time DME Agency Contacted: 1119 Representative spoke with at DME Agency: Pam HH Arranged: RN HH Agency: Other - See comment ([facility]) Date HH Agency Contacted: 02/18/23 Time HH Agency Contacted: 1119 Representative spoke with at [facility] Agency: Pam  Prior Living Arrangements/Services   Lives with:: Spouse Patient language and need for interpreter reviewed:: Yes Do you feel safe going back to the place where you live?: Yes      Need for Family Participation in Patient Care: Yes (Comment) Care giver support system in place?: Yes (comment) Current home services: Home RN Criminal Activity/Legal Involvement Pertinent to Current Situation/Hospitalization: No - Comment as needed  Activities of Daily Living   ADL Screening (condition at time of admission) Independently performs ADLs?: Yes (appropriate for developmental age) Is the patient deaf or have  difficulty hearing?: No Does the patient have difficulty seeing, even when wearing glasses/contacts?: No Does the patient have difficulty concentrating, remembering, or making decisions?: No  Permission Sought/Granted                  Emotional Assessment Appearance:: Appears stated age Attitude/Demeanor/Rapport: Engaged Affect (typically observed): Accepting Orientation: : Oriented to Self, Oriented to Place, Oriented to  Time, Oriented to Situation Alcohol / Substance Use: Not Applicable Psych Involvement: No (comment)  Admission diagnosis:  Osteomyelitis (HCC) [M86.9] Diabetes mellitus, new onset (HCC) [E11.9] Left foot infection [L08.9] Patient Active Problem List   Diagnosis Date Noted   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 02/15/2023   Hypertriglyceridemia 02/14/2023   Left foot infection 02/14/2023   Bradycardia 02/14/2023   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) 02/14/2023   Diabetes mellitus, new onset (HCC) 02/13/2023   Osteomyelitis (HCC) 02/13/2023   Chronic pain syndrome 02/10/2023   Chronic foot pain, left 02/10/2023   Ankle weakness 02/10/2023   Chronic bilateral low back pain without sciatica 02/10/2023   Hardware complicating wound infection (HCC) 02/04/2023   Encounter for opiate analgesic use agreement 02/04/2023   Encounter for orthopedic follow-up care 10/21/2018   Impingement syndrome of shoulder region 06/09/2018   Pain in joint of right shoulder 03/16/2018   PCP:  Jerrye Bushy, FNP Pharmacy:   [facility] - [place], [place] - [address]  Valentino Saxon [address] [place] [address] Phone: [phone] Fax: [phone]     Social Determinants of Health (SDOH) Social History: SDOH Screenings   Food Insecurity: No Food Insecurity (02/13/2023)  Housing: Low Risk  (02/13/2023)  Transportation Needs: No Transportation Needs (02/13/2023)  Utilities: Not At Risk (02/13/2023)  Depression (PHQ2-9): Low Risk  (02/10/2023)  Tobacco Use: Low Risk  (02/15/2023)    SDOH Interventions:     Readmission Risk Interventions     No data to display

## 2023-02-18 NOTE — Anesthesia Preprocedure Evaluation (Addendum)
Anesthesia Evaluation  Patient identified by MRN, date of birth, ID band Patient awake    Reviewed: Allergy & Precautions, NPO status , Patient's Chart, lab work & pertinent test results  History of Anesthesia Complications Negative for: history of anesthetic complications  Airway Mallampati: III  TM Distance: >3 FB Neck ROM: Full    Dental  (+) Dental Advisory Given   Pulmonary neg pulmonary ROS   Pulmonary exam normal breath sounds clear to auscultation       Cardiovascular (-) hypertension(-) angina (-) Past MI, (-) Cardiac Stents and (-) CABG + dysrhythmias (RBBB)  Rhythm:Regular Rate:Normal  Hypertriglyceridemia    Neuro/Psych neg Seizures Chronic pain syndrome  Neuromuscular disease (chronic back pain)    GI/Hepatic Neg liver ROS,GERD  Medicated,,  Endo/Other  diabetes, Type 2    Renal/GU negative Renal ROS     Musculoskeletal   Abdominal   Peds  Hematology negative hematology ROS (+) Lab Results      Component                Value               Date                      WBC                      6.7                 02/16/2023                HGB                      13.5                02/16/2023                HCT                      37.0 (L)            02/16/2023                MCV                      86.0                02/16/2023                PLT                      183                 02/16/2023              Anesthesia Other Findings 62 year old male PMH of HTN admitted for chronic osteomyelitis secondary to left foot surgery several months ago, now s/p bone biopsy and hardware removal.  Reproductive/Obstetrics                              Anesthesia Physical Anesthesia Plan  ASA: 3  Anesthesia Plan: MAC   Post-op Pain Management:    Induction: Intravenous  PONV Risk Score and Plan: 1 and Ondansetron, Propofol infusion and Treatment may vary due to age or medical  condition  Airway Management Planned: Natural Airway and Simple Face Mask  Additional Equipment:   Intra-op Plan:   Post-operative Plan:   Informed Consent: I have reviewed the patients History and Physical, chart, labs and discussed the procedure including the risks, benefits and alternatives for the proposed anesthesia with the patient or authorized representative who has indicated his/her understanding and acceptance.     Dental advisory given  Plan Discussed with: CRNA and Anesthesiologist  Anesthesia Plan Comments: (Discussed with patient risks of MAC including, but not limited to, minor pain or discomfort, hearing people in the room, and possible need for backup general anesthesia. Risks for general anesthesia also discussed including, but not limited to, sore throat, hoarse voice, chipped/damaged teeth, injury to vocal cords, nausea and vomiting, allergic reactions, lung infection, heart attack, stroke, and death. All questions answered. )         Anesthesia Quick Evaluation

## 2023-02-18 NOTE — Brief Op Note (Signed)
02/18/2023  5:39 PM  PATIENT:  Tollie Eth  62 y.o. male  PRE-OPERATIVE DIAGNOSIS:  Osteomeylitis left foot  POST-OPERATIVE DIAGNOSIS:  Osteomeylitis left foot  PROCEDURE:  Procedure(s) with comments: Repeat irrigation and debridement, resection of 5th metatarsal, antibiotic beads (Left) - Left foot resection of 5th metatarsal base, repeat washout, antibiotic beads stimulan  SURGEON:  Surgeons and Role:    * Merary Garguilo, Jenelle Mages, DPM - Primary  PHYSICIAN ASSISTANT:   ASSISTANTS: none   ANESTHESIA:   local and MAC  EBL:  Minimal  BLOOD ADMINISTERED:none  DRAINS: none   LOCAL MEDICATIONS USED:  MARCAINE     SPECIMEN:  Source of Specimen:  5th met base for pathology, distal 5th metatarsal with distal margin inked for pathology  DISPOSITION OF SPECIMEN:  PATHOLOGY  COUNTS:  YES  TOURNIQUET:  * Missing tourniquet times found for documented tourniquets in log: [id number] *  DICTATION: .Note written in EPIC  PLAN OF CARE:  Planned staged procedure, will return to OR later this admission 2-3 days pending results of distal margin path for repeat I&D, tendon transfer to cuboid.  PATIENT DISPOSITION:  PACU - hemodynamically stable.   Delay start of Pharmacological VTE agent (>24hrs) due to surgical blood loss or risk of bleeding: no

## 2023-02-18 NOTE — Assessment & Plan Note (Addendum)
Triglycerides 375, HDL 27. LDL 50. ASCVD 27.8%. Started statin in hospital. - Continue atorvastatin 40 mg daily

## 2023-02-18 NOTE — Plan of Care (Signed)
FMTS Brief Progress Note  S: Saw patient at bedside for postsurgical rounding.  Patient states he had pain initially after surgery but is feeling a lot better now.   O: BP 134/82 (BP Location: Left Arm)   Pulse 78   Temp 98.1 F (36.7 C) (Oral)   Resp 18   Ht 5\' 6"  (1.676 m)   Wt 82.1 kg   SpO2 95%   BMI 29.21 kg/m    General: Well appearing, NAD, awake, alert, responsive to questions Head: Normocephalic atraumatic CV: Regular rate and rhythm Respiratory: Clear to ausculation bilaterally, no wheezes rales or crackles, chest rises symmetrically,  no increased work of breathing Abdomen: Soft, non-tender, non-distended, normoactive bowel sounds  Extremities: Left leg with post op boot in place, toes with brisk cap refill, well perfused  A/P: Osteomyelitis Got resection of 5th metatarsal base with antibiotic bead placement earlier this evening.  -Podiatry is planning to return to the OR later this week for washout and tendon transfer -Continue IV antibiotics per ID input  - Orders reviewed. Labs for AM ordered, which was adjusted as needed.   Levin Erp, MD 02/18/2023, 11:35 PM PGY-3, [facility] Night Resident  Please page [phone] with questions.

## 2023-02-18 NOTE — Progress Notes (Signed)
   PODIATRY PROGRESS NOTE Patient Name: Tyler Rangel  DOB [birth year] DOA 02/13/2023  Hospital Day: 6  Assessment:  62 y.o. 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 Newly diagnosed DM A1c 9.   POD 3 s/p hardware removal and multiple bone biopsy 5th met base  AF, VSS  WBC: p ESR/CRP: CRP 1.9 ESR 1  Wound/Bone Cultures: Rare GNR, few GPCs  Imaging: 11/9 post op XR L foot 3 views NWB: Interval removal of surgical hardware involving old fracture of proximal fifth metatarsal.  Plan:  - NPO for OR today for I&D, further resection of proximal half of the 5th met base. Will send additional path samples for margin analysis. Antibiotic beads. Patient to return to OR later this week for washout, tendon transfer.  -  Pathology with confirmation of chronic OM at the nonunion site. Cultures with GPC and GNR - Appreciate ID input, following cultures. Continue IV abx. Likely require 6 weeks IV abx esp given plan for upcoming peroneal tendon transfer. .   - NWB in CAM boot to LLE Will continue to follow        Corinna Gab, DPM [facility]    Subjective:  Patient seen in pre op discussed plans for OR today as well as staged approach, plan for return later this week probably Thursday evening for completion. Agrees to proceed and all questions answered.   Objective:   Vitals:   02/18/23 0824 02/18/23 1552  BP: 132/77 (!) 156/95  Pulse: (!) 55 68  Resp: 17 16  Temp: 97.6 F (36.4 C)   SpO2: 96% 93%       Latest Ref Rng & Units 02/16/2023    5:45 AM 02/15/2023    5:42 AM 02/13/2023    2:54 PM  CBC  WBC 4.0 - 10.5 K/uL 6.7  6.8  7.2   Hemoglobin 13.0 - 17.0 g/dL 62.9  52.8  41.3   Hematocrit 39.0 - 52.0 % 37.0  38.2  43.3   Platelets 150 - 400 K/uL 183  199  246        Latest Ref Rng & Units 02/18/2023    3:55 AM 02/17/2023    6:31 AM 02/16/2023    5:45 AM  BMP  Glucose 70 - 99 mg/dL 244  010  272   BUN 8 - 23 mg/dL 15  16  22     Creatinine 0.61 - 1.24 mg/dL 5.36  6.44  0.34   Sodium 135 - 145 mmol/L 138  136  136   Potassium 3.5 - 5.1 mmol/L 3.7  3.4  3.4   Chloride 98 - 111 mmol/L 104  102  102   CO2 22 - 32 mmol/L 26  27  25    Calcium 8.9 - 10.3 mg/dL 9.1  8.8  8.8     General: AAOx3, NAD  Lower Extremity Exam Dressing C/DI to left foot CFT intact < 3 sec to all digits Pain on palpation of surgical site Sensation intact to light touch LLE  Radiology:  Results reviewed. See assessment for pertinent imaging results

## 2023-02-19 ENCOUNTER — Encounter (HOSPITAL_COMMUNITY): Payer: Self-pay | Admitting: Podiatry

## 2023-02-19 DIAGNOSIS — L089 Local infection of the skin and subcutaneous tissue, unspecified: Secondary | ICD-10-CM | POA: Diagnosis not present

## 2023-02-19 LAB — CBC
HCT: 38.4 % — ABNORMAL LOW (ref 39.0–52.0)
Hemoglobin: 13.9 g/dL (ref 13.0–17.0)
MCH: 31.8 pg (ref 26.0–34.0)
MCHC: 36.2 g/dL — ABNORMAL HIGH (ref 30.0–36.0)
MCV: 87.9 fL (ref 80.0–100.0)
Platelets: 189 10*3/uL (ref 150–400)
RBC: 4.37 MIL/uL (ref 4.22–5.81)
RDW: 12.7 % (ref 11.5–15.5)
WBC: 8.1 10*3/uL (ref 4.0–10.5)
nRBC: 0 % (ref 0.0–0.2)

## 2023-02-19 LAB — GLUCOSE, CAPILLARY
Glucose-Capillary: 157 mg/dL — ABNORMAL HIGH (ref 70–99)
Glucose-Capillary: 115 mg/dL — ABNORMAL HIGH (ref 70–99)
Glucose-Capillary: 124 mg/dL — ABNORMAL HIGH (ref 70–99)
Glucose-Capillary: 120 mg/dL — ABNORMAL HIGH (ref 70–99)

## 2023-02-19 LAB — BASIC METABOLIC PANEL
Anion gap: 8 (ref 5–15)
BUN: 14 mg/dL (ref 8–23)
CO2: 26 mmol/L (ref 22–32)
Calcium: 9 mg/dL (ref 8.9–10.3)
Chloride: 103 mmol/L (ref 98–111)
Creatinine, Ser: 1.03 mg/dL (ref 0.61–1.24)
GFR, Estimated: >60 mL/min (ref >60)
Glucose, Bld: 147 mg/dL — ABNORMAL HIGH (ref 70–99)
Potassium: 3.3 mmol/L — ABNORMAL LOW (ref 3.5–5.1)
Sodium: 137 mmol/L (ref 135–145)

## 2023-02-19 LAB — CK: Total CK: 78 U/L (ref 49–397)

## 2023-02-19 MED ORDER — POTASSIUM CHLORIDE CRYS ER 20 MEQ PO TBCR
40.0000 meq | EXTENDED_RELEASE_TABLET | Freq: Once | ORAL | Status: AC
  Administered 2023-02-19: 40 meq via ORAL
  Filled 2023-02-19: qty 2

## 2023-02-19 MED ORDER — HEPARIN SODIUM (PORCINE) 5000 UNIT/ML IJ SOLN
5000.0000 [IU] | Freq: Three times a day (TID) | INTRAMUSCULAR | Status: DC
  Administered 2023-02-19 – 2023-02-20 (×4): 5000 [IU] via SUBCUTANEOUS
  Filled 2023-02-19 (×5): qty 1

## 2023-02-19 MED ORDER — POLYETHYLENE GLYCOL 3350 17 G PO PACK
17.0000 g | PACK | Freq: Every day | ORAL | Status: DC | PRN
  Administered 2023-02-23 – 2023-02-24 (×2): 17 g via ORAL
  Filled 2023-02-19 (×2): qty 1

## 2023-02-19 NOTE — Progress Notes (Signed)
PODIATRY PROGRESS NOTE Patient Name: Tyler Rangel  DOB 10-Mar-1961 DOA 02/13/2023  Hospital Day: 7  Assessment:  62 y.o. 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 Newly diagnosed DM A1c 9.   POD 4 s/p hardware removal and multiple bone biopsy 5th met base  POD 1 s/p repeat irrigation and debridement with resection of proximal half of 5th metatarsal and placement of abx beads.   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 Antibiotic beads are present in the soft tissues at this level.    Plan:  - Will await pathology from OR yesterday prior to proceeding back to OR to determine if clean margin at cut end of 5th met shaft. If concern for further OM, will require further resection of 5th met. Plan for OR tomorrow at 5 or Friday pending availability of path result.  - Will also plan for abx bead exchange and peroneus brevis tendon transfer to cuboid. Hopeful this will be patients last procedure this admission.  -  Pathology with confirmation of chronic OM at the nonunion site. Culture Staph Caprae - Appreciate ID input, following cultures. Continue IV abx. Likely require 6 weeks IV abx esp given plan for upcoming peroneal tendon transfer. - NWB in CAM boot to LLE Will continue to follow        Corinna Gab, DPM [facility]    Subjective:  Patient seen bedside, states he is doing better this AM, pain under control. Showed him pictures from OR yesterday and discussed plans moving forward. All questions answered.   Objective:   Vitals:   02/19/23 0504 02/19/23 0746  BP: 113/89 126/81  Pulse: 63 (!) 57  Resp: 18 17  Temp: 98.4 F (36.9 C) 98.5 F (36.9 C)  SpO2: 97% 95%       Latest Ref Rng & Units 02/19/2023    3:46 AM  02/16/2023    5:45 AM 02/15/2023    5:42 AM  CBC  WBC 4.0 - 10.5 K/uL 8.1  6.7  6.8   Hemoglobin 13.0 - 17.0 g/dL 16.1  09.6  04.5   Hematocrit 39.0 - 52.0 % 38.4  37.0  38.2   Platelets 150 - 400 K/uL 189  183  199        Latest Ref Rng & Units 02/19/2023    3:46 AM 02/18/2023    3:55 AM 02/17/2023    6:31 AM  BMP  Glucose 70 - 99 mg/dL 409  811  914   BUN 8 - 23 mg/dL 14  15  16    Creatinine 0.61 - 1.24 mg/dL 7.82  9.56  2.13   Sodium 135 - 145 mmol/L 137  138  136   Potassium 3.5 - 5.1 mmol/L 3.3  3.7  3.4   Chloride 98 - 111 mmol/L 103  104  102   CO2 22 - 32 mmol/L 26  26  27    Calcium 8.9 - 10.3 mg/dL 9.0  9.1  8.8     General: AAOx3, NAD  Lower Extremity Exam Dressing C/DI to left foot CFT intact < 3 sec to all digits Pain on palpation of surgical site Sensation intact to light touch LLE  Radiology:  Results reviewed. See assessment for pertinent imaging  results

## 2023-02-19 NOTE — Assessment & Plan Note (Addendum)
New onset diabetes, random glucose greater than 300 with polyuria and polydipsia.  A1C 9.0. CBGs in 100s overnight.  - moderate SSI - CBGs QID - carb modified diet - TOC consult for assistance with finding a PCP - consider metformin at discharge

## 2023-02-19 NOTE — Progress Notes (Signed)
Physical Therapy Treatment Patient Details Name: Tyler Rangel MRN: [id number] DOB: 30-Oct-1960 Today's Date: 02/19/2023   History of Present Illness Tyler Rangel is a 62 year old male  left foot pain in the setting of chronic osteomyelitis secondary to left foot surgery several months ago; s/p removal of deep hardware and open biopsy on 11/9. S/p I&D, resection of L 5th metatarsal w/ antibiotic bead placement on 11/13. PMH of HTN, GERD    PT Comments  Pt in bed upon arrival and agreeable to PT session. Worked on overall strengthening and gait training in today's session. Pt was supervision with a hop to gait pattern with RW while maintaining WB precautions. Pt was able to perform LE and UE exercises with no increases in pain. Pt was fatigued after ambulating and performing exercises. Pt is progressing well towards goals. Acute PT to follow as pt has pending OR visits.      If plan is discharge home, recommend the following: Assist for transportation;Assistance with cooking/housework   Can travel by private vehicle      Yes  Equipment Recommendations  None recommended by PT       Precautions / Restrictions Precautions Precautions: None Required Braces or Orthoses: Other Brace (L CAM boot w/ OOB mobility) Restrictions Weight Bearing Restrictions: Yes LLE Weight Bearing: Non weight bearing Other Position/Activity Restrictions: in cam boot     Mobility  Bed Mobility Overal bed mobility: Independent     Transfers Overall transfer level: Needs assistance Equipment used: Rolling walker (2 wheels) Transfers: Sit to/from Stand Sit to Stand: Supervision    General transfer comment: supervision for safety    Ambulation/Gait Ambulation/Gait assistance: Supervision Gait Distance (Feet): 120 Feet Assistive device: Rolling walker (2 wheels) Gait Pattern/deviations: Step-to pattern       General Gait Details: step to gait pattern, pt able to maintain NWB precautions well. Steady w/ no  LOB         Balance Overall balance assessment: Needs assistance Sitting-balance support: Feet supported, No upper extremity supported Sitting balance-Leahy Scale: Good     Standing balance support: Bilateral upper extremity supported, During functional activity, Reliant on assistive device for balance Standing balance-Leahy Scale: Fair Standing balance comment: pt able to stand w/ no AD, reliant on AD for mobility         Cognition Arousal: Alert Behavior During Therapy: WFL for tasks assessed/performed Overall Cognitive Status: Within Functional Limits for tasks assessed    Exercises General Exercises - Lower Extremity Long Arc Quad: AROM, Both, 15 reps, Seated (w/ 3 sec hold) Hip Flexion/Marching: AROM, Both, 15 reps, Seated Other Exercises Other Exercises: 10 serial single leg STS w/ supervision for safety, RW Other Exercises: tricep dip in standing w/ RW x20    General Comments General comments (skin integrity, edema, etc.): VSS on RA      Pertinent Vitals/Pain Pain Assessment Pain Assessment: No/denies pain     PT Goals (current goals can now be found in the care plan section) Acute Rehab PT Goals PT Goal Formulation: With patient Time For Goal Achievement: 03/02/23 Potential to Achieve Goals: Good Progress towards PT goals: Progressing toward goals    Frequency    Min 1X/week       AM-PAC PT "6 Clicks" Mobility   Outcome Measure  Help needed turning from your back to your side while in a flat bed without using bedrails?: None Help needed moving from lying on your back to sitting on the side of a flat bed without using bedrails?: None  Help needed moving to and from a bed to a chair (including a wheelchair)?: A Little Help needed standing up from a chair using your arms (e.g., wheelchair or bedside chair)?: A Little Help needed to walk in hospital room?: A Little Help needed climbing 3-5 steps with a railing? : A Little 6 Click Score: 20    End  of Session Equipment Utilized During Treatment: Gait belt Activity Tolerance: Patient tolerated treatment well Patient left: in chair;with call bell/phone within reach Nurse Communication: Mobility status PT Visit Diagnosis: Other abnormalities of gait and mobility (R26.89)     Time: 1420-1443 PT Time Calculation (min) (ACUTE ONLY): 23 min  Charges:    $Gait Training: 8-22 mins $Therapeutic Exercise: 8-22 mins PT General Charges $$ ACUTE PT VISIT: 1 Visit                     Hilton Cork, PT, DPT Secure Chat Preferred  Rehab Office [phone]    Arturo Morton Brion Aliment 02/19/2023, 2:52 PM

## 2023-02-19 NOTE — Progress Notes (Cosign Needed Addendum)
Daily Progress Note Intern Pager: [phone]  Patient name: Tyler Rangel Medical record number: [id number] Date of birth: [birth year] Age: 62 y.o. Gender: male  Primary Care Provider: Jerrye Bushy, FNP Consultants: Podiatry, ID Code Status: Full    Pt Overview and Major Events to Date:  11/7: Admitted to FMTS 11/9: Bone biopsy/hardware removal of left foot 11/12: Repeat debridement, resection of 5th metatarsal   Assessment and Plan:   Buck Ilgenfritz is a 62 year old male PMH of HTN admitted for chronic osteomyelitis secondary to left foot surgery several months ago, now s/p bone biopsy and hardware removal (11/9) and repeat irrigation and debridement, resection of 5th metatarsal, and placement of antibiotic beads (11/12). Pain overall well-controlled. Constipation resolved.  Assessment & Plan Osteomyelitis (HCC) Chronic osteomyelitis and left foot after complications from surgery to fix left foot fracture in August. S/p bone biopsies and hardware removal on 11/9. Pain well-controlled at this time. Podiatry and ID on board, greatly appreciate their recommendations.  - Per podiatry, patient to return to OR tomorrow or Friday for  - Per ID: Daptomycin and cefepime for Staph caprae, Staph epidermidis, GNR on prior surgical cx. Continue to follow cultures and pathology. - PICC line placed 11/11 - Continue pain regimen:  - Gabapentin 400 TID - Flexeril 5 TID - Tylenol 650 mg q6h  - Oxycodone 5 q4 PRN for severe pain  - PT on board  - AM BMP - replete K as needed   Diabetes mellitus, new onset (HCC) New onset diabetes, random glucose greater than 300 with polyuria and polydipsia.  A1C 9.0. CBGs in 100s overnight.  - moderate SSI - CBGs QID - carb modified diet - TOC consult for assistance with finding a PCP - consider metformin at discharge Hypertriglyceridemia Triglycerides 375, HDL 27. LDL 50. ASCVD 27.8%. Started statin in hospital. - Continue atorvastatin 40 mg daily     Chronic and Stable Issues: Constipation - resolved. Continue senna BID and miralax prn  FEN/GI: carb modified  PPx: Heparin prior to procedure tomorrow/friday Dispo: Home pending clinical improvement and additional procedure with podiatry   Subjective:  Patient feeling fine this morning. Foot pain overall well-controlled following his procedure yesterday. Had multiple normal, nonbloody bowel movements yesterday and feeling a lot better. Tolerating PO without issue. No chest pain or SOB.   Objective: Temp:  [97.6 F (36.4 C)-98.5 F (36.9 C)] 98.5 F (36.9 C) (11/13 0746) Pulse Rate:  [55-78] 57 (11/13 0746) Resp:  [11-18] 17 (11/13 0746) BP: (113-163)/(74-99) 126/81 (11/13 0746) SpO2:  [93 %-100 %] 95 % (11/13 0746) Physical Exam: General: Well-appearing. No acute distress.  Cardiovascular: normal S1/S2. No extra heart sounds. Warm and well-perfused.  Respiratory: Breathing comfortably on RA. CTAB. No increased WOB. Abdomen: Soft, nontender, nondistended.  Extremities: Warm, well- perfused LLE with cap refill <2 secs. Appropriately wrapped, no visible drainage.   Laboratory: Most recent CBC Lab Results  Component Value Date   WBC 8.1 02/19/2023   HGB 13.9 02/19/2023   HCT 38.4 (L) 02/19/2023   MCV 87.9 02/19/2023   PLT 189 02/19/2023   Most recent BMP    Latest Ref Rng & Units 02/19/2023    3:46 AM  BMP  Glucose 70 - 99 mg/dL 259   BUN 8 - 23 mg/dL 14   Creatinine 5.63 - 1.24 mg/dL 8.75   Sodium 643 - 329 mmol/L 137   Potassium 3.5 - 5.1 mmol/L 3.3   Chloride 98 - 111 mmol/L 103   CO2  22 - 32 mmol/L 26   Calcium 8.9 - 10.3 mg/dL 9.0     Imaging/Diagnostic Tests: Foot XR post op 11/12: 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 Antibiotic beads are present in the soft tissues at this level.   Ivery Quale, MD 02/19/2023, 8:20 AM  PGY-1, [facility] Health Family Medicine FPTS Intern pager: [phone], text pages welcome Secure chat  group [facility] Teaching Service

## 2023-02-19 NOTE — Plan of Care (Signed)
  Problem: Clinical Measurements: Goal: Ability to maintain clinical measurements within normal limits will improve Outcome: Progressing   Problem: Activity: Goal: Risk for activity intolerance will decrease Outcome: Progressing   Problem: Nutrition: Goal: Adequate nutrition will be maintained Outcome: Progressing   Problem: Coping: Goal: Level of anxiety will decrease Outcome: Progressing   Problem: Elimination: Goal: Will not experience complications related to bowel motility Outcome: Progressing Goal: Will not experience complications related to urinary retention Outcome: Progressing   Problem: Pain Management: Goal: General experience of comfort will improve Outcome: Progressing   Problem: Safety: Goal: Ability to remain free from injury will improve Outcome: Progressing   Problem: Skin Integrity: Goal: Risk for impaired skin integrity will decrease Outcome: Progressing   Problem: Clinical Measurements: Goal: Postoperative complications will be avoided or minimized Outcome: Progressing   Problem: Skin Integrity: Goal: Demonstration of wound healing without infection will improve Outcome: Progressing

## 2023-02-19 NOTE — Assessment & Plan Note (Addendum)
Triglycerides 375, HDL 27. LDL 50. ASCVD 27.8%. Started statin in hospital. - Continue atorvastatin 40 mg daily

## 2023-02-19 NOTE — Assessment & Plan Note (Addendum)
Chronic osteomyelitis and left foot after complications from surgery to fix left foot fracture in August. S/p bone biopsies and hardware removal on 11/9. Pain well-controlled at this time. Podiatry and ID on board, greatly appreciate their recommendations.  - Per podiatry, patient to return to OR tomorrow or Friday for  - Per ID: Daptomycin and cefepime for Staph caprae, Staph epidermidis, GNR on prior surgical cx. Continue to follow cultures and pathology. - PICC line placed 11/11 - Continue pain regimen:  - Gabapentin 400 TID - Flexeril 5 TID - Tylenol 650 mg q6h  - Oxycodone 5 q4 PRN for severe pain  - PT on board  - AM BMP - replete K as needed

## 2023-02-20 DIAGNOSIS — L089 Local infection of the skin and subcutaneous tissue, unspecified: Secondary | ICD-10-CM | POA: Diagnosis not present

## 2023-02-20 DIAGNOSIS — E119 Type 2 diabetes mellitus without complications: Secondary | ICD-10-CM

## 2023-02-20 DIAGNOSIS — M869 Osteomyelitis, unspecified: Secondary | ICD-10-CM | POA: Diagnosis not present

## 2023-02-20 LAB — AEROBIC/ANAEROBIC CULTURE W GRAM STAIN (SURGICAL/DEEP WOUND): Culture: NO GROWTH

## 2023-02-20 LAB — BASIC METABOLIC PANEL
Anion gap: 7 (ref 5–15)
BUN: 18 mg/dL (ref 8–23)
CO2: 23 mmol/L (ref 22–32)
Calcium: 8.6 mg/dL — ABNORMAL LOW (ref 8.9–10.3)
Chloride: 104 mmol/L (ref 98–111)
Creatinine, Ser: 1.2 mg/dL (ref 0.61–1.24)
GFR, Estimated: >60 mL/min (ref >60)
Glucose, Bld: 168 mg/dL — ABNORMAL HIGH (ref 70–99)
Potassium: 3.3 mmol/L — ABNORMAL LOW (ref 3.5–5.1)
Sodium: 134 mmol/L — ABNORMAL LOW (ref 135–145)

## 2023-02-20 LAB — GLUCOSE, CAPILLARY
Glucose-Capillary: 140 mg/dL — ABNORMAL HIGH (ref 70–99)
Glucose-Capillary: 129 mg/dL — ABNORMAL HIGH (ref 70–99)
Glucose-Capillary: 132 mg/dL — ABNORMAL HIGH (ref 70–99)
Glucose-Capillary: 118 mg/dL — ABNORMAL HIGH (ref 70–99)
Glucose-Capillary: 177 mg/dL — ABNORMAL HIGH (ref 70–99)
Glucose-Capillary: 102 mg/dL — ABNORMAL HIGH (ref 70–99)

## 2023-02-20 LAB — SURGICAL PATHOLOGY

## 2023-02-20 MED ORDER — POTASSIUM CHLORIDE CRYS ER 20 MEQ PO TBCR
40.0000 meq | EXTENDED_RELEASE_TABLET | Freq: Once | ORAL | Status: AC
  Administered 2023-02-20: 40 meq via ORAL
  Filled 2023-02-20: qty 2

## 2023-02-20 NOTE — Assessment & Plan Note (Signed)
New onset diabetes, random glucose greater than 300 with polyuria and polydipsia.  A1C 9.0. CBGs in 120-140 overnight.  - moderate SSI - CBGs QID - carb modified diet - TOC consult for assistance with finding a PCP - consider metformin at discharge

## 2023-02-20 NOTE — Plan of Care (Signed)
  Problem: Education: Goal: Ability to describe self-care measures that may prevent or decrease complications (Diabetes Survival Skills Education) will improve Outcome: Progressing Goal: Individualized Educational Video(s) Outcome: Progressing   Problem: Coping: Goal: Ability to adjust to condition or change in health will improve Outcome: Progressing   Problem: Fluid Volume: Goal: Ability to maintain a balanced intake and output will improve Outcome: Progressing   Problem: Health Behavior/Discharge Planning: Goal: Ability to identify and utilize available resources and services will improve Outcome: Progressing Goal: Ability to manage health-related needs will improve Outcome: Progressing   Problem: Metabolic: Goal: Ability to maintain appropriate glucose levels will improve Outcome: Progressing   Problem: Nutritional: Goal: Maintenance of adequate nutrition will improve Outcome: Progressing Goal: Progress toward achieving an optimal weight will improve Outcome: Progressing   Problem: Skin Integrity: Goal: Risk for impaired skin integrity will decrease Outcome: Progressing   Problem: Tissue Perfusion: Goal: Adequacy of tissue perfusion will improve Outcome: Progressing   Problem: Education: Goal: Knowledge of General Education information will improve Description: Including pain rating scale, medication(s)/side effects and non-pharmacologic comfort measures Outcome: Progressing   Problem: Health Behavior/Discharge Planning: Goal: Ability to manage health-related needs will improve Outcome: Progressing   Problem: Clinical Measurements: Goal: Ability to maintain clinical measurements within normal limits will improve Outcome: Progressing Goal: Will remain free from infection Outcome: Progressing Goal: Diagnostic test results will improve Outcome: Progressing Goal: Respiratory complications will improve Outcome: Progressing Goal: Cardiovascular complication will  be avoided Outcome: Progressing   Problem: Activity: Goal: Risk for activity intolerance will decrease Outcome: Progressing   Problem: Nutrition: Goal: Adequate nutrition will be maintained Outcome: Progressing   Problem: Coping: Goal: Level of anxiety will decrease Outcome: Progressing   Problem: Elimination: Goal: Will not experience complications related to bowel motility Outcome: Progressing Goal: Will not experience complications related to urinary retention Outcome: Progressing   Problem: Pain Management: Goal: General experience of comfort will improve Outcome: Progressing   Problem: Safety: Goal: Ability to remain free from injury will improve Outcome: Progressing   Problem: Skin Integrity: Goal: Risk for impaired skin integrity will decrease Outcome: Progressing   Problem: Education: Goal: Required Educational Video(s) Outcome: Progressing   Problem: Clinical Measurements: Goal: Postoperative complications will be avoided or minimized Outcome: Progressing   Problem: Skin Integrity: Goal: Demonstration of wound healing without infection will improve Outcome: Progressing

## 2023-02-20 NOTE — Assessment & Plan Note (Signed)
Triglycerides 375, HDL 27. LDL 50. ASCVD 27.8%. Started statin in hospital. - Continue atorvastatin 40 mg daily

## 2023-02-20 NOTE — Progress Notes (Signed)
Daily Progress Note Intern Pager: [phone]  Patient name: Olton Prejean Medical record number: [id number] Date of birth: November 18, 1960 Age: 62 y.o. Gender: male  Primary Care Provider: Jerrye Bushy, FNP Consultants: Podiatry, ID Code Status: Full    Pt Overview and Major Events to Date:  11/7: Admitted to FMTS 11/9: Bone biopsy/hardware removal of left foot 11/12: Repeat debridement, resection of 5th metatarsal   Assessment and Plan:   Daimien Vanderzee is a 62 year old male PMH of HTN admitted for chronic osteomyelitis secondary to left foot surgery several months ago, now s/p bone biopsy and hardware removal (11/9) and repeat irrigation and debridement, resection of 5th metatarsal, and placement of antibiotic beads (11/12). Pain overall well-controlled. Constipation resolved.  Assessment & Plan Osteomyelitis (HCC) Chronic osteomyelitis and left foot after complications from surgery to fix left foot fracture in August. S/p bone biopsies, hardware removal on 11/9 and repeat debridement with 5th metatarsal resection on 11/12. Pain well-controlled at this time. Podiatry and ID on board, greatly appreciate their recommendations.  - Per podiatry, patient to return to OR tomorrow 11/15 for tendon transfer  - Stop heparin 8 hours prior to procedure tomorrow - NPO at MN - Per ID: Daptomycin and Cefepime for Staph caprae, Staph epidermidis, GNR on prior surgical cx. Continue to follow cultures and pathology. - PICC line placed 11/11 - Continue pain regimen:  - Gabapentin 400 TID - Flexeril 5 TID - Tylenol 650 mg q6h  - Oxycodone 5 q4 PRN for severe pain  - PT on board  - AM BMP - replete K as needed   Diabetes mellitus, new onset (HCC) New onset diabetes, random glucose greater than 300 with polyuria and polydipsia.  A1C 9.0. CBGs in 120-140 overnight.  - moderate SSI - CBGs QID - carb modified diet - TOC consult for assistance with finding a PCP - consider metformin at  discharge Hypertriglyceridemia Triglycerides 375, HDL 27. LDL 50. ASCVD 27.8%. Started statin in hospital. - Continue atorvastatin 40 mg daily   Chronic and Stable Issues: Constipation: Resolved. Continue Miralax PRN daily.   FEN/GI: Carb modified PPx: Heparin prior to procedure tomorrow Dispo: Home pending clinical improvement and additional procedure with podiatry   Subjective:  Patient feeling well this morning. Foot pain well-controlled. Had another bowel movement yesterday. Tolerating PO without issue. PT going well. No sharp chest pain, SOB, headache.   Objective: Temp:  [97.8 F (36.6 C)-98.6 F (37 C)] 98 F (36.7 C) (11/14 0736) Pulse Rate:  [57-63] 62 (11/14 0736) Resp:  [17-18] 17 (11/14 0736) BP: (120-129)/(78-94) 124/94 (11/14 0736) SpO2:  [95 %-99 %] 99 % (11/14 0736) Physical Exam: General: Well-appearing. Resting comfortably in room. CV: Normal S1/S2. No extra heart sounds. Warm and well-perfused. Pulm: Breathing comfortably on room air. CTAB. No increased WOB. Abd: Soft, non-tender, non-distended. Ext: Well-wrapped LLE with boot in place. Warm, dry L toes with normal cap refill. Normal sensation and movement of L toes. Skin:  Warm, dry. Psych: Pleasant and appropriate.   Laboratory: Most recent CBC Lab Results  Component Value Date   WBC 8.1 02/19/2023   HGB 13.9 02/19/2023   HCT 38.4 (L) 02/19/2023   MCV 87.9 02/19/2023   PLT 189 02/19/2023   Most recent BMP    Latest Ref Rng & Units 02/20/2023    3:32 AM  BMP  Glucose 70 - 99 mg/dL 130   BUN 8 - 23 mg/dL 18   Creatinine 8.65 - 1.24 mg/dL 7.84   Sodium  135 - 145 mmol/L 134   Potassium 3.5 - 5.1 mmol/L 3.3   Chloride 98 - 111 mmol/L 104   CO2 22 - 32 mmol/L 23   Calcium 8.9 - 10.3 mg/dL 8.6     Ivery Quale, MD 02/20/2023, 7:39 AM  PGY-1, [facility] Health Family Medicine FPTS Intern pager: [phone], text pages welcome Secure chat group [facility] Teaching Service

## 2023-02-20 NOTE — Progress Notes (Signed)
Mobility Specialist: Progress Note   02/20/23 1440  Mobility  Activity Ambulated with assistance in hallway;Ambulated independently in room  Level of Assistance Standby assist, set-up cues, supervision of patient - no hands on (modI in room)  Press photographer wheel walker  Distance Ambulated (ft) 150 ft  LLE Weight Bearing NWB  Activity Response Tolerated well  Mobility Referral Yes  $Mobility charge 1 Mobility  Mobility Specialist Start Time (ACUTE ONLY) 1135  Mobility Specialist Stop Time (ACUTE ONLY) 1147  Mobility Specialist Time Calculation (min) (ACUTE ONLY) 12 min    Pt was agreeable to mobility session - received in bed. Moves modI in the room with RW and IV pole; educated pt on waiting for staff to get up. Sv throughout. Took 2x standing breaks d/t fatigue. Steady, fast paced gait. Returned to room without fault. Left on couch with all needs met, call bell in reach.   Maurene Capes Mobility Specialist Please contact via SecureChat or Rehab office at [phone]

## 2023-02-20 NOTE — Assessment & Plan Note (Addendum)
Chronic osteomyelitis and left foot after complications from surgery to fix left foot fracture in August. S/p bone biopsies, hardware removal on 11/9 and repeat debridement with 5th metatarsal resection on 11/12. Pain well-controlled at this time. Podiatry and ID on board, greatly appreciate their recommendations.  - Per podiatry, patient to return to OR tomorrow 11/15 for tendon transfer  - Stop heparin 8 hours prior to procedure tomorrow - NPO at MN - Per ID: Daptomycin and Cefepime for Staph caprae, Staph epidermidis, GNR on prior surgical cx. Continue to follow cultures and pathology. - PICC line placed 11/11 - Continue pain regimen:  - Gabapentin 400 TID - Flexeril 5 TID - Tylenol 650 mg q6h  - Oxycodone 5 q4 PRN for severe pain  - PT on board  - AM BMP - replete K as needed

## 2023-02-21 ENCOUNTER — Inpatient Hospital Stay (HOSPITAL_COMMUNITY): Payer: 59 | Admitting: Certified Registered Nurse Anesthetist

## 2023-02-21 ENCOUNTER — Other Ambulatory Visit: Payer: Self-pay

## 2023-02-21 ENCOUNTER — Encounter (HOSPITAL_COMMUNITY)
Admission: EM | Disposition: A | Discharge status: Home Health | Payer: Self-pay | Source: Home / Self Care | Attending: Family Medicine

## 2023-02-21 ENCOUNTER — Encounter (HOSPITAL_COMMUNITY): Payer: Self-pay | Admitting: Student

## 2023-02-21 DIAGNOSIS — E1169 Type 2 diabetes mellitus with other specified complication: Secondary | ICD-10-CM | POA: Diagnosis not present

## 2023-02-21 DIAGNOSIS — M86172 Other acute osteomyelitis, left ankle and foot: Secondary | ICD-10-CM | POA: Diagnosis not present

## 2023-02-21 DIAGNOSIS — T847XXA Infection and inflammatory reaction due to other internal orthopedic prosthetic devices, implants and grafts, initial encounter: Secondary | ICD-10-CM | POA: Diagnosis not present

## 2023-02-21 DIAGNOSIS — M869 Osteomyelitis, unspecified: Secondary | ICD-10-CM | POA: Diagnosis not present

## 2023-02-21 DIAGNOSIS — M86672 Other chronic osteomyelitis, left ankle and foot: Secondary | ICD-10-CM | POA: Diagnosis not present

## 2023-02-21 DIAGNOSIS — E119 Type 2 diabetes mellitus without complications: Secondary | ICD-10-CM | POA: Diagnosis not present

## 2023-02-21 DIAGNOSIS — L089 Local infection of the skin and subcutaneous tissue, unspecified: Secondary | ICD-10-CM | POA: Diagnosis not present

## 2023-02-21 HISTORY — PX: TENDON REPAIR: SHX5111

## 2023-02-21 HISTORY — PX: IRRIGATION AND DEBRIDEMENT FOOT: SHX6602

## 2023-02-21 LAB — GLUCOSE, CAPILLARY
Glucose-Capillary: 158 mg/dL — ABNORMAL HIGH (ref 70–99)
Glucose-Capillary: 105 mg/dL — ABNORMAL HIGH (ref 70–99)
Glucose-Capillary: 92 mg/dL (ref 70–99)
Glucose-Capillary: 126 mg/dL — ABNORMAL HIGH (ref 70–99)
Glucose-Capillary: 135 mg/dL — ABNORMAL HIGH (ref 70–99)
Glucose-Capillary: 301 mg/dL — ABNORMAL HIGH (ref 70–99)

## 2023-02-21 LAB — AEROBIC/ANAEROBIC CULTURE W GRAM STAIN (SURGICAL/DEEP WOUND)

## 2023-02-21 LAB — BASIC METABOLIC PANEL
Anion gap: 8 (ref 5–15)
BUN: 16 mg/dL (ref 8–23)
CO2: 23 mmol/L (ref 22–32)
Calcium: 9 mg/dL (ref 8.9–10.3)
Chloride: 106 mmol/L (ref 98–111)
Creatinine, Ser: 0.95 mg/dL (ref 0.61–1.24)
GFR, Estimated: >60 mL/min (ref >60)
Glucose, Bld: 169 mg/dL — ABNORMAL HIGH (ref 70–99)
Potassium: 3.4 mmol/L — ABNORMAL LOW (ref 3.5–5.1)
Sodium: 137 mmol/L (ref 135–145)

## 2023-02-21 SURGERY — IRRIGATION AND DEBRIDEMENT FOOT
Anesthesia: General | Laterality: Left

## 2023-02-21 MED ORDER — DEXAMETHASONE SODIUM PHOSPHATE 10 MG/ML IJ SOLN
INTRAMUSCULAR | Status: DC | PRN
  Administered 2023-02-21: 5 mg via INTRAVENOUS

## 2023-02-21 MED ORDER — VANCOMYCIN HCL 1 G IV SOLR
INTRAVENOUS | Status: DC | PRN
Start: 2023-02-21 — End: 2023-02-21
  Administered 2023-02-21: 1000 mg

## 2023-02-21 MED ORDER — LIDOCAINE 2% (20 MG/ML) 5 ML SYRINGE
INTRAMUSCULAR | Status: DC | PRN
  Administered 2023-02-21: 40 mg via INTRAVENOUS

## 2023-02-21 MED ORDER — FENTANYL CITRATE (PF) 100 MCG/2ML IJ SOLN
25.0000 ug | INTRAMUSCULAR | Status: DC | PRN
  Administered 2023-02-21 (×3): 50 ug via INTRAVENOUS

## 2023-02-21 MED ORDER — LACTATED RINGERS IV SOLN
INTRAVENOUS | Status: DC
Start: 2023-02-21 — End: 2023-02-21

## 2023-02-21 MED ORDER — TOBRAMYCIN SULFATE 80 MG/2ML IJ SOLN
INTRAMUSCULAR | Status: DC | PRN
Start: 2023-02-21 — End: 2023-02-21
  Administered 2023-02-21: 80 mg via INTRAMUSCULAR

## 2023-02-21 MED ORDER — PROPOFOL 10 MG/ML IV BOLUS
INTRAVENOUS | Status: DC | PRN
  Administered 2023-02-21: 200 mg via INTRAVENOUS

## 2023-02-21 MED ORDER — MIDAZOLAM HCL 2 MG/2ML IJ SOLN
INTRAMUSCULAR | Status: AC
Start: 2023-02-21 — End: ?
  Filled 2023-02-21: qty 2

## 2023-02-21 MED ORDER — OXYCODONE HCL 5 MG/5ML PO SOLN: 5.0000 mg | Freq: Once | ORAL | Status: DC | PRN

## 2023-02-21 MED ORDER — ACETAMINOPHEN 10 MG/ML IV SOLN
1000.0000 mg | Freq: Once | INTRAVENOUS | Status: DC | PRN
Start: 2023-02-21 — End: 2023-02-21

## 2023-02-21 MED ORDER — FENTANYL CITRATE (PF) 250 MCG/5ML IJ SOLN
INTRAMUSCULAR | Status: AC
  Filled 2023-02-21: qty 5

## 2023-02-21 MED ORDER — OXYCODONE HCL 5 MG PO TABS
5.0000 mg | ORAL_TABLET | ORAL | Status: DC | PRN
  Administered 2023-02-21 (×2): 5 mg via ORAL
  Filled 2023-02-21 (×3): qty 1

## 2023-02-21 MED ORDER — ACETAMINOPHEN 325 MG PO TABS: 325.0000 mg | ORAL_TABLET | ORAL | Status: DC | PRN

## 2023-02-21 MED ORDER — MIDAZOLAM HCL 2 MG/2ML IJ SOLN
INTRAMUSCULAR | Status: DC | PRN
  Administered 2023-02-21: 2 mg via INTRAVENOUS

## 2023-02-21 MED ORDER — POTASSIUM CHLORIDE CRYS ER 20 MEQ PO TBCR
20.0000 meq | EXTENDED_RELEASE_TABLET | Freq: Once | ORAL | Status: AC
  Administered 2023-02-21: 20 meq via ORAL
  Filled 2023-02-21: qty 1

## 2023-02-21 MED ORDER — BUPIVACAINE HCL 0.5 % IJ SOLN
INTRAMUSCULAR | Status: AC
  Filled 2023-02-21: qty 1

## 2023-02-21 MED ORDER — TOBRAMYCIN SULFATE 1.2 G IJ SOLR
INTRAMUSCULAR | Status: AC
  Filled 2023-02-21: qty 1.2

## 2023-02-21 MED ORDER — FENTANYL CITRATE (PF) 250 MCG/5ML IJ SOLN
INTRAMUSCULAR | Status: DC | PRN
  Administered 2023-02-21 (×2): 50 ug via INTRAVENOUS

## 2023-02-21 MED ORDER — PROPOFOL 10 MG/ML IV BOLUS
INTRAVENOUS | Status: AC
  Filled 2023-02-21: qty 20

## 2023-02-21 MED ORDER — INSULIN ASPART 100 UNIT/ML IJ SOLN
11.0000 [IU] | Freq: Once | INTRAMUSCULAR | Status: AC
  Administered 2023-02-21: 11 [IU] via SUBCUTANEOUS

## 2023-02-21 MED ORDER — ORAL CARE MOUTH RINSE: 15.0000 mL | Freq: Once | OROMUCOSAL | Status: AC

## 2023-02-21 MED ORDER — BUPIVACAINE HCL (PF) 0.5 % IJ SOLN
INTRAMUSCULAR | Status: DC | PRN
  Administered 2023-02-21: 20 mL

## 2023-02-21 MED ORDER — VANCOMYCIN HCL 1000 MG IV SOLR
INTRAVENOUS | Status: AC
  Filled 2023-02-21: qty 20

## 2023-02-21 MED ORDER — OXYCODONE HCL 5 MG PO TABS: 5.0000 mg | ORAL_TABLET | Freq: Once | ORAL | Status: DC | PRN

## 2023-02-21 MED ORDER — FENTANYL CITRATE (PF) 100 MCG/2ML IJ SOLN
INTRAMUSCULAR | Status: AC
  Filled 2023-02-21: qty 2

## 2023-02-21 MED ORDER — ACETAMINOPHEN 160 MG/5ML PO SOLN
325.0000 mg | ORAL | Status: DC | PRN
Start: 2023-02-21 — End: 2023-02-21

## 2023-02-21 MED ORDER — DROPERIDOL 2.5 MG/ML IJ SOLN: 0.6250 mg | Freq: Once | INTRAMUSCULAR | Status: DC | PRN

## 2023-02-21 MED ORDER — CHLORHEXIDINE GLUCONATE 0.12 % MT SOLN: 15.0000 mL | Freq: Once | OROMUCOSAL | Status: AC

## 2023-02-21 MED ORDER — ONDANSETRON HCL 4 MG/2ML IJ SOLN
INTRAMUSCULAR | Status: DC | PRN
  Administered 2023-02-21: 4 mg via INTRAVENOUS

## 2023-02-21 MED ORDER — LIDOCAINE HCL 2 % IJ SOLN
INTRAMUSCULAR | Status: AC
  Filled 2023-02-21: qty 20

## 2023-02-21 MED ORDER — CHLORHEXIDINE GLUCONATE 0.12 % MT SOLN
OROMUCOSAL | Status: AC
  Administered 2023-02-21: 15 mL via OROMUCOSAL
  Filled 2023-02-21: qty 15

## 2023-02-21 MED ORDER — SODIUM CHLORIDE 0.9 % IR SOLN
Status: DC | PRN
Start: 2023-02-21 — End: 2023-02-21
  Administered 2023-02-21: 3000 mL

## 2023-02-21 SURGICAL SUPPLY — 47 items
BEADS BIO ARTH CALC SULFAT 5CC (Bone Implant) IMPLANT
BIOBEADS ARTH CALC SULFATE 5CC (Bone Implant) ×1 IMPLANT
BLADE OSC/SAGITTAL MD 5.5X18 (BLADE) ×1 IMPLANT
BLADE SURG 10 STRL SS (BLADE) ×1 IMPLANT
BLADE SURG 15 STRL LF DISP TIS (BLADE) ×2 IMPLANT
BLADE SURG 15 STRL SS (BLADE) ×2
BNDG COHESIVE 3X5 TAN ST LF (GAUZE/BANDAGES/DRESSINGS) ×1 IMPLANT
BNDG ELASTIC 3INX 5YD STR LF (GAUZE/BANDAGES/DRESSINGS) ×1 IMPLANT
BNDG ELASTIC 4INX 5YD STR LF (GAUZE/BANDAGES/DRESSINGS) IMPLANT
BNDG ELASTIC 4X5.8 VLCR NS LF (GAUZE/BANDAGES/DRESSINGS) IMPLANT
BNDG ESMARK 4X9 LF (GAUZE/BANDAGES/DRESSINGS) ×1 IMPLANT
CALCIUM SULFATE BIOBEADS BONE FILLER 5CC ×1 IMPLANT
CHLORAPREP W/TINT 26 (MISCELLANEOUS) ×1 IMPLANT
DRSG NON-ADHERENT DERMACEA 3X4 (GAUZE/BANDAGES/DRESSINGS) IMPLANT
DRSG XEROFORM 1X8 (GAUZE/BANDAGES/DRESSINGS) IMPLANT
ELECT REM PT RETURN 9FT ADLT (ELECTROSURGICAL) ×1
ELECTRODE REM PT RTRN 9FT ADLT (ELECTROSURGICAL) ×1 IMPLANT
GAUZE SPONGE 2X2 STRL 8-PLY (GAUZE/BANDAGES/DRESSINGS) ×2 IMPLANT
GAUZE SPONGE 4X4 12PLY STRL (GAUZE/BANDAGES/DRESSINGS) ×1 IMPLANT
GAUZE STRETCH 2X75IN STRL (MISCELLANEOUS) ×1 IMPLANT
GAUZE XEROFORM 1X8 LF (GAUZE/BANDAGES/DRESSINGS) ×1 IMPLANT
GLOVE BIO SURGEON STRL SZ7.5 (GLOVE) ×1 IMPLANT
GLOVE BIOGEL PI IND STRL 7.5 (GLOVE) ×1 IMPLANT
GOWN STRL REUS W/ TWL LRG LVL3 (GOWN DISPOSABLE) ×2 IMPLANT
GOWN STRL REUS W/TWL LRG LVL3 (GOWN DISPOSABLE) ×2
HANDPIECE INTERPULSE COAX TIP (DISPOSABLE) ×1
KIT BASIN OR (CUSTOM PROCEDURE TRAY) ×1 IMPLANT
KIT SUTURETAK 2.4 DRILL BIT (KITS) IMPLANT
MANIFOLD NEPTUNE II (INSTRUMENTS) IMPLANT
NDL BIOPSY JAMSHIDI 8X6 (NEEDLE) IMPLANT
NDL HYPO 25X1 1.5 SAFETY (NEEDLE) ×2 IMPLANT
NEEDLE BIOPSY JAMSHIDI 8X6 (NEEDLE) IMPLANT
NEEDLE HYPO 25X1 1.5 SAFETY (NEEDLE) ×2 IMPLANT
PACK ORTHO EXTREMITY (CUSTOM PROCEDURE TRAY) ×1 IMPLANT
PAD ABD 8X10 STRL (GAUZE/BANDAGES/DRESSINGS) IMPLANT
PADDING CAST ABS COTTON 4X4 ST (CAST SUPPLIES) ×2 IMPLANT
SET HNDPC FAN SPRY TIP SCT (DISPOSABLE) IMPLANT
SPIKE FLUID TRANSFER (MISCELLANEOUS) ×2 IMPLANT
STAPLER VISISTAT 35W (STAPLE) IMPLANT
STOCKINETTE 4X48 STRL (DRAPES) ×1 IMPLANT
SUT ETHILON 3 0 FSLX (SUTURE) ×1 IMPLANT
SUT PROLENE 4 0 PS 2 18 (SUTURE) ×2 IMPLANT
SUTURE TAPE 3.0 DBL LOAD S-TAK (Anchor) IMPLANT
SUTURETAPE 3.0 DBL LOAD S-TAK (Anchor) ×1 IMPLANT
SYR CONTROL 10ML LL (SYRINGE) ×2 IMPLANT
UNDERPAD 30X36 HEAVY ABSORB (UNDERPADS AND DIAPERS) ×1 IMPLANT
WATER STERILE IRR 1000ML POUR (IV SOLUTION) ×1 IMPLANT

## 2023-02-21 NOTE — Plan of Care (Signed)
  Problem: Nutritional: Goal: Maintenance of adequate nutrition will improve Outcome: Progressing   Problem: Skin Integrity: Goal: Risk for impaired skin integrity will decrease Outcome: Progressing   Problem: Activity: Goal: Risk for activity intolerance will decrease Outcome: Progressing   Problem: Pain Management: Goal: General experience of comfort will improve Outcome: Not Progressing

## 2023-02-21 NOTE — Anesthesia Preprocedure Evaluation (Signed)
Anesthesia Evaluation  Patient identified by MRN, date of birth, ID band Patient awake    Reviewed: Allergy & Precautions, NPO status , Patient's Chart, lab work & pertinent test results  Airway Mallampati: III  TM Distance: <3 FB Neck ROM: Full    Dental  (+) Teeth Intact, Dental Advisory Given   Pulmonary neg pulmonary ROS   breath sounds clear to auscultation       Cardiovascular hypertension, Pt. on medications  Rhythm:Regular Rate:Normal     Neuro/Psych negative neurological ROS  negative psych ROS   GI/Hepatic Neg liver ROS,GERD  Medicated,,  Endo/Other  diabetes    Renal/GU negative Renal ROS     Musculoskeletal negative musculoskeletal ROS (+)    Abdominal   Peds  Hematology negative hematology ROS (+)   Anesthesia Other Findings   Reproductive/Obstetrics                             Anesthesia Physical Anesthesia Plan  ASA: 2  Anesthesia Plan: General   Post-op Pain Management: Tylenol PO (pre-op)*, Toradol IV (intra-op)* and Gabapentin PO (pre-op)*   Induction: Intravenous  PONV Risk Score and Plan: 3 and Dexamethasone, Midazolam and Ondansetron  Airway Management Planned: LMA  Additional Equipment: None  Intra-op Plan:   Post-operative Plan: Extubation in OR  Informed Consent: I have reviewed the patients History and Physical, chart, labs and discussed the procedure including the risks, benefits and alternatives for the proposed anesthesia with the patient or authorized representative who has indicated his/her understanding and acceptance.     Dental advisory given  Plan Discussed with: CRNA  Anesthesia Plan Comments:        Anesthesia Quick Evaluation

## 2023-02-21 NOTE — Brief Op Note (Signed)
02/21/2023  3:02 PM  PATIENT:  Tyler Rangel  62 y.o. male  PRE-OPERATIVE DIAGNOSIS:  OSTEOMYLITIS LEFT FOOT  POST-OPERATIVE DIAGNOSIS:  * No post-op diagnosis entered *  PROCEDURE:  Procedure(s): REPEAT IRRIGATION AND DEBRIDEMENT FOOT WITH ANTIBIOTIC BEAD EXCHANGE (Left) TENDON TRANSFER (Left)  SURGEON:  Surgeons and Role:    * Therron Sells, Jenelle Mages, DPM - Primary  PHYSICIAN ASSISTANT:   ASSISTANTS: none   ANESTHESIA:   general  EBL:  Minimal  BLOOD ADMINISTERED:none  DRAINS: none   LOCAL MEDICATIONS USED:  MARCAINE     SPECIMEN:  Source of Specimen:  5th met distal margin  DISPOSITION OF SPECIMEN:  PATHOLOGY  COUNTS:  YES  TOURNIQUET:  calf 250, 45 min  DICTATION: .Note written in EPIC  PLAN OF CARE: Admit to inpatient   PATIENT DISPOSITION:  PACU - hemodynamically stable.   Delay start of Pharmacological VTE agent (>24hrs) due to surgical blood loss or risk of bleeding: no

## 2023-02-21 NOTE — Assessment & Plan Note (Addendum)
New onset diabetes, random glucose greater than 300 with polyuria and polydipsia.  A1C 9.0. CBGs in 100-150s overnight.  - moderate SSI - CBGs QID - carb modified diet - consider metformin at discharge

## 2023-02-21 NOTE — Plan of Care (Signed)
FMTS Brief Progress Note  S: Saw patient at bedside with Dr. Rexene Alberts during night rounds.  Today patient went back to the OR for repeat irrigation and debridement of left foot for osteomyelitis.  On arrival patient was laying comfortably in bed with brother at bedside.  He said he was in significant pain following surgery however pain has since improved and rates it a 5 out of 10 tonight.  Denies any chest pain, shortness of breath, dyspnea or headaches.   O: BP (!) 146/98 (BP Location: Left Arm)   Pulse 79   Temp 98.2 F (36.8 C)   Resp 20   Ht 5\' 6"  (1.676 m)   Wt 83.9 kg   SpO2 97%   BMI 29.86 kg/m   General: Alert, well appearing, NAD CV: RRR, well-perfused Pulm: CTAB, good WOB on RA, no crackles or wheezing Ext: LLE in orthopedic boot   A/P: Left foot osteomyelitis Pain well-controlled.  S/p repeat debridement and irrigation. -Scheduled Tylenol 1000 mg every 6 hours -Oxycodone 5mg  every 4 hours as needed - Orders reviewed. Labs for AM ordered, which was adjusted as needed.  -Continue plan per day team.  Jerre Simon, MD 02/21/2023, 10:49 PM PGY-3, [facility] Night Resident  Please page [phone] with questions.

## 2023-02-21 NOTE — Progress Notes (Signed)
CCC Pre-op Review  Pre-op checklist: To be completed by bedside RN  NPO: Yes per note from nightshift  Labs: K +3.4, PCR Neg  Consent: No orders. Surgeon does consent at bedside  H&P: Hospitalist  Vitals: Low pulse  O2 requirements: RA  MAR/PTA review: got of K+ at 0515 (11/15), Maxipime due @ noon, Heparin last dose was 2106 (11/14)  IV: Single lumen picc  Floor nurse name:  Vernetta Honey, RN   Additional info:

## 2023-02-21 NOTE — Anesthesia Procedure Notes (Signed)
Procedure Name: LMA Insertion Date/Time: 02/21/2023 1:55 PM  Performed by: Shary Decamp, CRNAPre-anesthesia Checklist: Patient identified, Emergency Drugs available, Suction available and Patient being monitored Patient Re-evaluated:Patient Re-evaluated prior to induction Oxygen Delivery Method: Circle system utilized Preoxygenation: Pre-oxygenation with 100% oxygen Induction Type: IV induction Ventilation: Mask ventilation without difficulty LMA: LMA flexible inserted LMA Size: 5.0 Number of attempts: 1 Placement Confirmation: positive ETCO2 and breath sounds checked- equal and bilateral Tube secured with: Tape Dental Injury: Teeth and Oropharynx as per pre-operative assessment

## 2023-02-21 NOTE — Transfer of Care (Signed)
Immediate Anesthesia Transfer of Care Note  Patient: Tyler Rangel  Procedure(s) Performed: REPEAT IRRIGATION AND DEBRIDEMENT FOOT WITH ANTIBIOTIC BEAD EXCHANGE (Left) TENDON TRANSFER (Left)  Patient Location: PACU  Anesthesia Type:General  Level of Consciousness: awake  Airway & Oxygen Therapy: Patient Spontanous Breathing  Post-op Assessment: Report given to RN and Post -op Vital signs reviewed and stable  Post vital signs: Reviewed and stable  Last Vitals:  Vitals Value Taken Time  BP 159/96 02/21/23 1509  Temp    Pulse 72 02/21/23 1513  Resp 13 02/21/23 1513  SpO2 98 % 02/21/23 1513  Vitals shown include unfiled device data.  Last Pain:  Vitals:   02/21/23 1250  TempSrc:   PainSc: 0-No pain      Patients Stated Pain Goal: 0 (02/21/23 0506)  Complications: No notable events documented.

## 2023-02-21 NOTE — Progress Notes (Addendum)
Daily Progress Note Intern Pager: [phone]  Patient name: Tyler Rangel Medical record number: [id number] Date of birth: [birth year] Age: 62 y.o. Gender: male  Primary Care Provider: Jerrye Bushy, FNP Consultants: Podiatry, ID Code Status: Full    Pt Overview and Major Events to Date:  11/7: Admitted to FMTS 11/9: Bone biopsy/hardware removal of left foot 11/12: Repeat debridement, resection of 5th metatarsal   Assessment and Plan:   Tyler Rangel is a 62 year old male PMH of HTN admitted for chronic osteomyelitis secondary to left foot surgery several months ago, now s/p bone biopsy and hardware removal (11/9) and repeat irrigation and debridement, resection of 5th metatarsal, and placement of antibiotic beads (11/12). Pain overall well-controlled. Constipation resolved. Planning for OR today with podiatry.  Assessment & Plan Osteomyelitis (HCC) Chronic osteomyelitis and left foot after complications from surgery to fix left foot fracture in August. S/p bone biopsies, hardware removal on 11/9 and repeat debridement with 5th metatarsal resection on 11/12. Pain well-controlled at this time. Podiatry and ID on board, greatly appreciate their recommendations.  - Patient to return to OR today 11/15 for tendon transfer with podiatry  - Stop heparin 8 hours prior to procedure tomorrow - NPO at [facility] - Per ID: Daptomycin and Cefepime for Staph caprae, Staph epidermidis, GNR on prior surgical cx. Continue to follow cultures and pathology. - PICC line placed 11/11 - Continue pain regimen:  - Gabapentin 400 TID - Flexeril 5 TID - Tylenol 650 mg q6h  - Oxycodone 5 q4 PRN for severe pain  - PT on board  - AM CBC, BMP - replete K as needed   Diabetes mellitus, new onset (HCC) New onset diabetes, random glucose greater than 300 with polyuria and polydipsia.  A1C 9.0. CBGs in 100-150s overnight.  - moderate SSI - CBGs QID - carb modified diet - consider metformin at  discharge Hypertriglyceridemia Triglycerides 375, HDL 27. LDL 50. ASCVD 27.8%. Started statin in hospital. - Continue atorvastatin 40 mg daily   Chronic and Stable Issues: Constipation: Resolved. Continue Miralax PRN daily.   FEN/GI: NPO prior to procedure today PPx: Heparin held prior to procedure  Dispo: Home pending procedure and clinical stability   Subjective:  Patient feeling well this morning. L foot pain continues to be well-controlled. No reported changes to foot. No CP, SOB, headache. No abdominal pain, had a normal BM recently.   Objective: Temp:  [97.6 F (36.4 C)-98.6 F (37 C)] 98 F (36.7 C) (11/15 0413) Pulse Rate:  [57-68] 57 (11/15 0413) Resp:  [17-18] 18 (11/15 0413) BP: (123-131)/(73-94) 123/73 (11/15 0413) SpO2:  [97 %-100 %] 97 % (11/15 0413) Physical Exam: General: Well-appearing. Resting comfortably in recliner. CV: Normal S1/S2. No extra heart sounds. Warm and well-perfused. Pulm: Breathing comfortably on room air. CTAB. No increased WOB. Abd: Soft, non-tender, non-distended. Ext: Well-wrapped LLE with boot in place. Warm, dry L toes with normal cap refill <2 s. Normal sensation and movement of L toes.  Skin:  Warm, dry. Psych: Pleasant and appropriate.   Laboratory: Most recent CBC Lab Results  Component Value Date   WBC 8.1 02/19/2023   HGB 13.9 02/19/2023   HCT 38.4 (L) 02/19/2023   MCV 87.9 02/19/2023   PLT 189 02/19/2023   Most recent BMP    Latest Ref Rng & Units 02/21/2023    1:08 AM  BMP  Glucose 70 - 99 mg/dL 220   BUN 8 - 23 mg/dL 16   Creatinine 2.54 - 1.24  mg/dL 1.61   Sodium 096 - 045 mmol/L 137   Potassium 3.5 - 5.1 mmol/L 3.4   Chloride 98 - 111 mmol/L 106   CO2 22 - 32 mmol/L 23   Calcium 8.9 - 10.3 mg/dL 9.0    Ivery Quale, MD 02/21/2023, 7:26 AM  PGY-1, [facility] FPTS Intern pager: [phone], text pages welcome Secure chat group [facility]

## 2023-02-21 NOTE — Progress Notes (Signed)
   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 Newly diagnosed DM A1c 9.   POD 6 s/p hardware removal and multiple bone biopsy 5th met base  POD 3 s/p repeat irrigation and debridement with resection of proximal half of 5th metatarsal and placement of abx beads.   AF, VSS  WBC: 8.1 ESR/CRP: CRP 1.9 ESR 1  Wound/Bone Cultures: Tissue 5th met base: Staph Caprae,  Path: chronic osteomyelitis of the nonunion site bone 5th met Path 11/13: BONE, 5TH METATARSAL DISTAL MARGIN, RESECTION:  -  Fragments of bone and articular cartilage with bony remodeling,  chronic osteomyelitis and focal evidence of acute osteomyelitis.  -  The previously inked margin is negative for acute osteomyelitis.   Imaging:  02/18/23 post op: 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 Antibiotic beads are present in the soft tissues at this level.    Plan:  - NPO for OR today for repeat I&D of the site, antibiotic bead exchange, peroneus brevis to cuboid tendon transfer.   -  Pathology with confirmation of chronic OM at the nonunion site. Culture Staph Caprae - Path from 2nd surgery without evidence of acute OM at the distal resection margin of the 5th met shaft - Appreciate ID input, following cultures. Continue IV abx.  - NWB in CAM boot to LLE Will continue to follow        Corinna Gab, DPM [facility]    Subjective:  Patient seen pre op, aware of plan for OR today and going forward after hospital, all questions answered.   Objective:   Vitals:   02/21/23 0810 02/21/23 1233  BP: 123/82 (!) 141/84  Pulse: (!) 57 (!) 59  Resp: 17 17  Temp: 97.6 F (36.4 C) 98.3 F (36.8 C)  SpO2: 97% 98%       Latest Ref Rng & Units 02/19/2023    3:46 AM 02/16/2023    5:45 AM 02/15/2023    5:42 AM  CBC  WBC 4.0 - 10.5 K/uL 8.1  6.7   6.8   Hemoglobin 13.0 - 17.0 g/dL 16.1  09.6  04.5   Hematocrit 39.0 - 52.0 % 38.4  37.0  38.2   Platelets 150 - 400 K/uL 189  183  199        Latest Ref Rng & Units 02/21/2023    1:08 AM 02/20/2023    3:32 AM 02/19/2023    3:46 AM  BMP  Glucose 70 - 99 mg/dL 409  811  914   BUN 8 - 23 mg/dL 16  18  14    Creatinine 0.61 - 1.24 mg/dL 7.82  9.56  2.13   Sodium 135 - 145 mmol/L 137  134  137   Potassium 3.5 - 5.1 mmol/L 3.4  3.3  3.3   Chloride 98 - 111 mmol/L 106  104  103   CO2 22 - 32 mmol/L 23  23  26    Calcium 8.9 - 10.3 mg/dL 9.0  8.6  9.0     General: AAOx3, NAD  Lower Extremity Exam Dressing C/DI to left foot CFT intact < 3 sec to all digits Pain on palpation of surgical site Sensation intact to light touch LLE  Radiology:  Results reviewed. See assessment for pertinent imaging results

## 2023-02-21 NOTE — Assessment & Plan Note (Addendum)
Chronic osteomyelitis and left foot after complications from surgery to fix left foot fracture in August. S/p bone biopsies, hardware removal on 11/9 and repeat debridement with 5th metatarsal resection on 11/12. Pain well-controlled at this time. Podiatry and ID on board, greatly appreciate their recommendations.  - Patient to return to OR today 11/15 for tendon transfer with podiatry  - Stop heparin 8 hours prior to procedure tomorrow - NPO at MN - Per ID: Daptomycin and Cefepime for Staph caprae, Staph epidermidis, GNR on prior surgical cx. Continue to follow cultures and pathology. - PICC line placed 11/11 - Continue pain regimen:  - Gabapentin 400 TID - Flexeril 5 TID - Tylenol 650 mg q6h  - Oxycodone 5 q4 PRN for severe pain  - PT on board  - AM CBC, BMP - replete K as needed

## 2023-02-21 NOTE — Progress Notes (Signed)
1610  KEPT NPO since Midnight as prescribed for OR today.

## 2023-02-21 NOTE — Op Note (Signed)
Full Operative Report  Date of Operation: 3:03 PM, 02/21/2023   Patient: Tyler Rangel - 62 y.o. male  Surgeon: Pilar Plate, DPM   Assistant: None  Diagnosis: OSTEOMYLITIS LEFT FOOT  Procedure:  1. ***    Anesthesia: General  Shelton Silvas, MD  Anesthesiologist: Shelton Silvas, MD CRNA: Loleta Rose, CRNA; Shary Decamp, CRNA   Estimated Blood Loss: Minimal ***  Hemostasis: 1) Anatomical dissection, mechanical compression, electrocautery 2) ***  Implants: * No implants in log *  Materials: ***  Injectables: 1) Pre-operatively: *** cc of 50:50 mixture 1%lidocaine plain and 0.5% marcaine plain 2) Post-operatively: None ***  Specimens: Pathology: ***  Microbiology: ***   Antibiotics: IV antibiotics given per schedule on the floor  Drains: None  Complications: Patient tolerated the procedure well without complication.   Operative findings: As below in detailed report  Indications for Procedure: [name] presents to Pilar Plate, DPM with a chief complaint of *** The patient has failed conservative treatments of various modalities. At this time the patient has elected to proceed with surgical correction. All alternatives, risks, and complications of the procedures were thoroughly explained to the patient. Patient exhibits appropriate understanding of all discussion points and informed consent was signed and obtained in the chart with no guarantees to surgical outcome given or implied.  Description of Procedure: Patient was brought to the operating room. Patient remained on their hospital bed in the supine position. A surgical timeout was performed and all members of the operating room, the procedure, and the surgical site were identified. anesthesia occurred as per anesthesia record. Local anesthetic as previously described was then injected about the operative field in a local infiltrative block.  The operative lower extremity as noted above  was then prepped and draped in the usual sterile manner. The following procedure then began.  ***  The surgical site was then dressed with ***. The patient tolerated both the procedure and anesthesia well with vital signs stable throughout. The patient was transferred in good condition and all vital signs stable  from the OR to recovery under the discretion of anesthesia.  Condition: Vital signs stable, neurovascular status unchanged from preoperative   Surgical plan:  ***  The patient will be *** in a *** to the operative limb until further instructed. The dressing is to remain clean, dry, and intact. Will continue to follow unless noted elsewhere.   Carlena Hurl, DPM [facility]

## 2023-02-21 NOTE — Progress Notes (Signed)
PT Cancellation Note  Patient Details Name: Tyler Rangel MRN: [id number] DOB: 11-Mar-1961   Cancelled Treatment:    Reason Eval/Treat Not Completed: Patient at procedure or test/unavailable. Acute PT will continue to follow as able.  Hilton Cork, PT, DPT Secure Chat Preferred  Rehab Office [phone]   Arturo Morton Brion Aliment 02/21/2023, 3:41 PM

## 2023-02-21 NOTE — Assessment & Plan Note (Signed)
 Triglycerides 375, HDL 27. LDL 50. ASCVD 27.8%. Started statin in hospital. - Continue atorvastatin 40 mg daily

## 2023-02-21 NOTE — Plan of Care (Signed)
  Problem: Education: Goal: Ability to describe self-care measures that may prevent or decrease complications (Diabetes Survival Skills Education) will improve Outcome: Progressing Goal: Individualized Educational Video(s) Outcome: Progressing   Problem: Coping: Goal: Ability to adjust to condition or change in health will improve Outcome: Progressing   Problem: Fluid Volume: Goal: Ability to maintain a balanced intake and output will improve Outcome: Progressing   Problem: Health Behavior/Discharge Planning: Goal: Ability to identify and utilize available resources and services will improve Outcome: Progressing Goal: Ability to manage health-related needs will improve Outcome: Progressing   Problem: Metabolic: Goal: Ability to maintain appropriate glucose levels will improve Outcome: Progressing   Problem: Nutritional: Goal: Maintenance of adequate nutrition will improve Outcome: Progressing Goal: Progress toward achieving an optimal weight will improve Outcome: Progressing   Problem: Skin Integrity: Goal: Risk for impaired skin integrity will decrease Outcome: Progressing   Problem: Tissue Perfusion: Goal: Adequacy of tissue perfusion will improve Outcome: Progressing   Problem: Education: Goal: Knowledge of General Education information will improve Description: Including pain rating scale, medication(s)/side effects and non-pharmacologic comfort measures Outcome: Progressing   Problem: Health Behavior/Discharge Planning: Goal: Ability to manage health-related needs will improve Outcome: Progressing   Problem: Clinical Measurements: Goal: Ability to maintain clinical measurements within normal limits will improve Outcome: Progressing Goal: Will remain free from infection Outcome: Progressing Goal: Diagnostic test results will improve Outcome: Progressing Goal: Respiratory complications will improve Outcome: Progressing Goal: Cardiovascular complication will  be avoided Outcome: Progressing   Problem: Activity: Goal: Risk for activity intolerance will decrease Outcome: Progressing   Problem: Nutrition: Goal: Adequate nutrition will be maintained Outcome: Progressing   Problem: Coping: Goal: Level of anxiety will decrease Outcome: Progressing   Problem: Elimination: Goal: Will not experience complications related to bowel motility Outcome: Progressing Goal: Will not experience complications related to urinary retention Outcome: Progressing   Problem: Pain Management: Goal: General experience of comfort will improve Outcome: Progressing   Problem: Safety: Goal: Ability to remain free from injury will improve Outcome: Progressing   Problem: Skin Integrity: Goal: Risk for impaired skin integrity will decrease Outcome: Progressing   Problem: Education: Goal: Required Educational Video(s) Outcome: Progressing   Problem: Clinical Measurements: Goal: Postoperative complications will be avoided or minimized Outcome: Progressing   Problem: Skin Integrity: Goal: Demonstration of wound healing without infection will improve Outcome: Progressing

## 2023-02-21 NOTE — Progress Notes (Signed)
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 Annamary Rummage who is electively admitted for hardware removal of left foot of the nonunion area of the fifth metatarsal. See my note on 10/29 for details.   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 CX with MRSE and staph caprae. Path fifth proximal metatarsal margin with chronic osteomyelitis 11/12 s/p I&D with resection of proximal half of fifth metatarsal base, application of antibiotic beads. path with distal resection margin with chronic osteomyelitis and focal evidence of acute osteomyelitis. The previously inked margin is negative for acute osteomyelitis.   Recommendations - will switch IV abtx to daptomycin only to cover MRSE, MS staph caprae and prior MSSA - Plan to go for OR later today for repeat I and D and abtx bead exchange - Monitor CBC, BMP and CPK - Anticipate 6 weeks of IV abtx pending further surgical plans  Dr Renold Don covering this weekend  and will fu cultures. New ID team on Monday.  Rest of the management as per the primary team. Thank you for the consult. Please page with pertinent questions or concerns.  ______________________________________________________________________ Subjective patient seen and examined at the bedside. He has no complaints and wants to get his infected foot cured   Vitals BP (!) 141/84   Pulse (!) 59   Temp 98.3 F (36.8 C) (Oral)   Resp 17   Ht 5\' 6"  (1.676 m)   Wt 83.9 kg   SpO2 98%   BMI 29.86 kg/m     Physical Exam Constitutional: Adult male sitting in the recliner and appears comfortable, nontoxic appearing    Comments: HEENT wnl  Cardiovascular:     Rate and Rhythm: Normal rate and regular rhythm.     Heart sounds:   Pulmonary:     Effort: Pulmonary effort is normal.     Comments:   Abdominal:     Palpations:     Tenderness: Nondistended  Musculoskeletal:        General: No swelling or tenderness in peripheral joints, left foot is bandaged C/D/I, RT arm PICC   Skin:    Comments: No rashes   Neurological:     General: awake, alert and oriented, following commands   Psychiatric:        Mood and Affect: Mood normal.   Pertinent Microbiology Results for orders placed or performed during the hospital encounter of 02/13/23  Surgical pcr screen     Status: None   Collection Time: 02/14/23 10:53 PM   Specimen: Nasal Mucosa; Nasal Swab  Result Value Ref Range Status   MRSA, PCR  NEGATIVE NEGATIVE Final   Staphylococcus aureus NEGATIVE NEGATIVE Final    Comment: (NOTE) The Xpert SA Assay (FDA approved for NASAL specimens in patients 64 years of age and older), is one component of a comprehensive surveillance program. It is not intended to diagnose infection nor to guide or monitor treatment. Performed at [facility], [address], [place], [place] [address]   Aerobic/Anaerobic Culture w Gram Stain (surgical/deep wound)     Status: None   Collection Time:  02/15/23  8:10 AM   Specimen: PATH Gross Only; Tissue  Result Value Ref Range Status   Specimen Description OTHER  Final   Special Requests left foot explanted hardware  Final   Gram Stain RARE WBC SEEN RARE GRAM POSITIVE COCCI   Final   Culture   Final    RARE STAPHYLOCOCCUS EPIDERMIDIS RARE STAPHYLOCOCCUS CAPRAE SUSCEPTIBILITIES PERFORMED ON PREVIOUS CULTURE WITHIN THE LAST 5 DAYS. NO ANAEROBES ISOLATED Performed at [facility], [address] [place], [place] [address]    Report Status 02/21/2023 FINAL  Final   Organism ID, Bacteria STAPHYLOCOCCUS EPIDERMIDIS  Final      Susceptibility   Staphylococcus epidermidis - MIC*    CIPROFLOXACIN <=0.5 SENSITIVE Sensitive     ERYTHROMYCIN <=0.25 SENSITIVE Sensitive     GENTAMICIN <=0.5 SENSITIVE Sensitive     OXACILLIN >=4 RESISTANT Resistant     TETRACYCLINE >=16 RESISTANT Resistant     VANCOMYCIN 2 SENSITIVE Sensitive     TRIMETH/SULFA <=10 SENSITIVE Sensitive     CLINDAMYCIN >=8 RESISTANT Resistant     RIFAMPIN <=0.5 SENSITIVE Sensitive     Inducible Clindamycin NEGATIVE Sensitive     * RARE STAPHYLOCOCCUS EPIDERMIDIS  Aerobic/Anaerobic Culture w Gram Stain (surgical/deep wound)     Status: None   Collection Time: 02/15/23  8:13 AM   Specimen: PATH Bone biopsy; Tissue  Result Value Ref Range Status   Specimen Description TISSUE  Final   Special Requests fifth metatarsal base  Final   Gram Stain   Final    RARE WBC SEEN FEW GRAM POSITIVE COCCI RARE GRAM NEGATIVE RODS    Culture   Final    RARE STAPHYLOCOCCUS CAPRAE NO ANAEROBES ISOLATED Performed at [facility], [address], [place], [place] [address]    Report Status 02/20/2023 FINAL  Final   Organism ID, Bacteria STAPHYLOCOCCUS CAPRAE  Final      Susceptibility   Staphylococcus caprae - MIC*    CIPROFLOXACIN <=0.5 SENSITIVE Sensitive     ERYTHROMYCIN <=0.25 SENSITIVE Sensitive     GENTAMICIN <=0.5 SENSITIVE Sensitive     OXACILLIN <=0.25 SENSITIVE  Sensitive     TETRACYCLINE <=1 SENSITIVE Sensitive     VANCOMYCIN <=0.5 SENSITIVE Sensitive     TRIMETH/SULFA <=10 SENSITIVE Sensitive     CLINDAMYCIN <=0.25 SENSITIVE Sensitive     RIFAMPIN <=0.5 SENSITIVE Sensitive     Inducible Clindamycin NEGATIVE Sensitive     * RARE STAPHYLOCOCCUS CAPRAE  Aerobic/Anaerobic Culture w Gram Stain (surgical/deep wound)     Status: None   Collection Time: 02/15/23  8:18 AM   Specimen: PATH Bone biopsy; Tissue  Result Value Ref Range Status   Specimen Description TISSUE  Final   Special Requests fifth metatrsal non union  Final   Gram Stain RARE GRAM NEGATIVE RODS FEW GRAM POSITIVE COCCI   Final   Culture   Final    No growth aerobically or anaerobically. Performed at [facility], [address] [place], [place] [address]    Report Status 02/20/2023 FINAL  Final   Pathology 11/9 FINAL MICROSCOPIC DIAGNOSIS:   A. BONE, FIFTH PROXIMAL METATARSAL MARGIN, EXCISION:  -  Cartilage and bone with bony regeneration and marrow space with  chronic osteomyelitis and serous atrophy.   B. BONE, FIFTH METATARSAL, BIOPSY:  -  Scant fragments of necrotic bone (apparent loss of osteoclasts on  decalcified bone) with marrow fibrosis   11/13  FINAL MICROSCOPIC DIAGNOSIS:   A. BONE, 5TH METATARSAL BASE, RESECTION:  -  Fragment of articular surface and bone with evidence of degenerative  change and bony remodeling.  -  Negative for acute osteomyelitis on sections examined.   B. BONE, 5TH METATARSAL DISTAL MARGIN, RESECTION:  -  Fragments of bone and articular cartilage with bony remodeling,  chronic osteomyelitis and focal evidence of acute osteomyelitis.  -  The previously inked margin is negative for acute osteomyelitis.     Pertinent Lab.    Latest Ref Rng & Units 02/19/2023    3:46 AM 02/16/2023    5:45 AM 02/15/2023    5:42 AM  CBC  WBC 4.0 - 10.5 K/uL 8.1  6.7  6.8   Hemoglobin 13.0 - 17.0 g/dL 78.4  69.6  29.5   Hematocrit 39.0 -  52.0 % 38.4  37.0  38.2   Platelets 150 - 400 K/uL 189  183  199       Latest Ref Rng & Units 02/21/2023    1:08 AM 02/20/2023    3:32 AM 02/19/2023    3:46 AM  CMP  Glucose 70 - 99 mg/dL 284  132  440   BUN 8 - 23 mg/dL 16  18  14    Creatinine 0.61 - 1.24 mg/dL 1.02  7.25  3.66   Sodium 135 - 145 mmol/L 137  134  137   Potassium 3.5 - 5.1 mmol/L 3.4  3.3  3.3   Chloride 98 - 111 mmol/L 106  104  103   CO2 22 - 32 mmol/L 23  23  26    Calcium 8.9 - 10.3 mg/dL 9.0  8.6  9.0      Pertinent Imaging today Plain films and CT images have been personally visualized and interpreted; radiology reports have been reviewed. Decision making incorporated into the Impression /   No results found.  I have personally spent 51 minutes involved in face-to-face and non-face-to-face activities for this patient on the day of the visit. Professional time spent includes the following activities: Preparing to see the patient (review of tests), Obtaining and/or reviewing separately obtained history (admission/discharge record), Performing a medically appropriate examination and/or evaluation , Ordering medications/tests/procedures, referring and communicating with other health care professionals, Documenting clinical information in the EMR, Independently interpreting results (not separately reported), Communicating results to the patient/family/caregiver, Counseling and educating the patient/family/caregiver and Care coordination (not separately reported).   Plan d/w requesting provider as well as ID pharm D  Of note, portions of this note may have been created with voice recognition software. While this note has been edited for accuracy, occasional wrong-word or 'sound-a-like' substitutions may have occurred due to the inherent limitations of voice recognition software.   Electronically signed by:   Odette Fraction, MD Infectious Disease Physician [facility] Pager: [phone]

## 2023-02-22 DIAGNOSIS — L089 Local infection of the skin and subcutaneous tissue, unspecified: Secondary | ICD-10-CM | POA: Diagnosis not present

## 2023-02-22 DIAGNOSIS — E119 Type 2 diabetes mellitus without complications: Secondary | ICD-10-CM | POA: Diagnosis not present

## 2023-02-22 DIAGNOSIS — T847XXA Infection and inflammatory reaction due to other internal orthopedic prosthetic devices, implants and grafts, initial encounter: Secondary | ICD-10-CM | POA: Diagnosis not present

## 2023-02-22 DIAGNOSIS — R3911 Hesitancy of micturition: Secondary | ICD-10-CM | POA: Insufficient documentation

## 2023-02-22 LAB — URINALYSIS, ROUTINE W REFLEX MICROSCOPIC
Bacteria, UA: NONE SEEN
Bilirubin Urine: NEGATIVE
Glucose, UA: 50 mg/dL — AB
Hgb urine dipstick: NEGATIVE
Ketones, ur: 5 mg/dL — AB
Leukocytes,Ua: NEGATIVE
Nitrite: NEGATIVE
Protein, ur: 30 mg/dL — AB
Specific Gravity, Urine: 1.029 (ref 1.005–1.030)
pH: 5 (ref 5.0–8.0)

## 2023-02-22 LAB — CBC
HCT: 36.2 % — ABNORMAL LOW (ref 39.0–52.0)
Hemoglobin: 13.3 g/dL (ref 13.0–17.0)
MCH: 32.1 pg (ref 26.0–34.0)
MCHC: 36.7 g/dL — ABNORMAL HIGH (ref 30.0–36.0)
MCV: 87.4 fL (ref 80.0–100.0)
Platelets: 220 10*3/uL (ref 150–400)
RBC: 4.14 MIL/uL — ABNORMAL LOW (ref 4.22–5.81)
RDW: 12.6 % (ref 11.5–15.5)
WBC: 9.5 10*3/uL (ref 4.0–10.5)
nRBC: 0 % (ref 0.0–0.2)

## 2023-02-22 LAB — PSA: Prostatic Specific Antigen: 2.74 ng/mL (ref 0.00–4.00)

## 2023-02-22 LAB — GLUCOSE, CAPILLARY
Glucose-Capillary: 141 mg/dL — ABNORMAL HIGH (ref 70–99)
Glucose-Capillary: 167 mg/dL — ABNORMAL HIGH (ref 70–99)
Glucose-Capillary: 179 mg/dL — ABNORMAL HIGH (ref 70–99)
Glucose-Capillary: 186 mg/dL — ABNORMAL HIGH (ref 70–99)
Glucose-Capillary: 202 mg/dL — ABNORMAL HIGH (ref 70–99)

## 2023-02-22 LAB — BASIC METABOLIC PANEL
Anion gap: 7 (ref 5–15)
BUN: 17 mg/dL (ref 8–23)
CO2: 22 mmol/L (ref 22–32)
Calcium: 8.9 mg/dL (ref 8.9–10.3)
Chloride: 104 mmol/L (ref 98–111)
Creatinine, Ser: 0.73 mg/dL (ref 0.61–1.24)
GFR, Estimated: >60 mL/min (ref >60)
Glucose, Bld: 144 mg/dL — ABNORMAL HIGH (ref 70–99)
Potassium: 4 mmol/L (ref 3.5–5.1)
Sodium: 133 mmol/L — ABNORMAL LOW (ref 135–145)

## 2023-02-22 MED ORDER — TAMSULOSIN HCL 0.4 MG PO CAPS
0.4000 mg | ORAL_CAPSULE | Freq: Every day | ORAL | Status: DC
  Administered 2023-02-22 – 2023-02-24 (×3): 0.4 mg via ORAL
  Filled 2023-02-22 (×3): qty 1

## 2023-02-22 MED ORDER — HEPARIN SODIUM (PORCINE) 5000 UNIT/ML IJ SOLN
5000.0000 [IU] | Freq: Three times a day (TID) | INTRAMUSCULAR | Status: DC
  Administered 2023-02-22 – 2023-02-24 (×7): 5000 [IU] via SUBCUTANEOUS
  Filled 2023-02-22 (×7): qty 1

## 2023-02-22 MED ORDER — DAPTOMYCIN IV (FOR PTA / DISCHARGE USE ONLY): 700.0000 mg | INTRAVENOUS | 0 refills | Status: AC

## 2023-02-22 NOTE — Anesthesia Postprocedure Evaluation (Signed)
Anesthesia Post Note  Patient: Tyler Rangel  Procedure(s) Performed: REPEAT IRRIGATION AND DEBRIDEMENT FOOT WITH ANTIBIOTIC BEAD EXCHANGE (Left) TENDON TRANSFER (Left)     Patient location during evaluation: PACU Anesthesia Type: General Level of consciousness: awake and alert Pain management: pain level controlled Vital Signs Assessment: post-procedure vital signs reviewed and stable Respiratory status: spontaneous breathing, nonlabored ventilation, respiratory function stable and patient connected to nasal cannula oxygen Cardiovascular status: blood pressure returned to baseline and stable Postop Assessment: no apparent nausea or vomiting Anesthetic complications: no   No notable events documented.          Shelton Silvas

## 2023-02-22 NOTE — Plan of Care (Signed)
  Problem: Education: Goal: Ability to describe self-care measures that may prevent or decrease complications (Diabetes Survival Skills Education) will improve Outcome: Progressing   Problem: Education: Goal: Knowledge of General Education information will improve Description: Including pain rating scale, medication(s)/side effects and non-pharmacologic comfort measures Outcome: Progressing

## 2023-02-22 NOTE — Progress Notes (Signed)
Mobility Specialist: Progress Note   02/22/23 1546  Mobility  Activity Ambulated with assistance in hallway  Level of Assistance Standby assist, set-up cues, supervision of patient - no hands on  Assistive Device Front wheel walker  Distance Ambulated (ft) 200 ft  LLE Weight Bearing NWB  Activity Response Tolerated well  Mobility Referral Yes  $Mobility charge 1 Mobility  Mobility Specialist Start Time (ACUTE ONLY) 1450  Mobility Specialist Stop Time (ACUTE ONLY) 1506  Mobility Specialist Time Calculation (min) (ACUTE ONLY) 16 min    Pt was agreeable to mobility session - received in chair. No complaints; very high energy today - stopped multiple times in the middle of ambulation to do additional exercises or stretches. Moving very well, has to be reminded to slow down. Can balance and perform activities without RW as well but still informed to let staff know for safety. Returned to room without fault. Left in bed with all needs met, call bell in reach.   Maurene Capes Mobility Specialist Please contact via SecureChat or Rehab office at [phone]

## 2023-02-22 NOTE — Progress Notes (Signed)
Daily Progress Note Intern Pager: [phone]  Patient name: Tyler Rangel Medical record number: [id number] Date of birth: 09-Nov-1960 Age: 62 y.o. Gender: male  Primary Care Provider: Jerrye Bushy, FNP Consultants: Podiatry, ID Code Status: Full  Pt Overview and Major Events to Date:  11/7: Admitted to FMTS 11/9: Bone biopsy/hardware removal of left foot 11/12: Repeat debridement, resection of 5th metatarsal 11/15: repeat irrigation and debridement L foot, tendon transfer  Assessment and Plan:  62 year old male with history of hypertension, GERD who was admitted for chronic osteomyelitis in the setting of left foot surgery several months ago.  Patient has had the above procedures and is postop day 1 from repeat irrigation and debridement and tendon transfer of left foot. Assessment & Plan Osteomyelitis (HCC) Chronic osteomyelitis and left foot after complications from surgery to fix left foot fracture in August. S/p bone biopsies, hardware removal on 11/9 and repeat debridement with 5th metatarsal resection on 11/12.  Now postop day 1 irrigation and debridement along with tendon transfer left foot.  Following podiatry and infectious disease recommendations. - Per ID: Daptomycin to cover MRSE, MS staph caprae, prior MSSA - PICC line placed 11/11 - Continue pain regimen:  - Gabapentin 400 TID - Flexeril 5 mg 3 times daily - Tylenol 1000 mg q6h  - Oxycodone 5 q4 PRN for severe pain  - Ibuprofen 600 mg every 6 hours as needed for mild to moderate pain - PT - AM CBC, BMP Diabetes mellitus, new onset (HCC) New onset diabetes, random glucose greater than 300 with polyuria and polydipsia.  A1C 9.0.  1 elevated blood sugar last night at 301 after surgery, however this normalized after mealtime insulin was given. - moderate SSI - CBGs QID - carb modified diet Urinary hesitancy Reports this been going on for several months, feels like his urinary stream comes in intervals. Suspect this  could be 2/2 BPH - flomax 0.4mg  QD - PSA, U/A - bladder scan with post void residual  Chronic and Stable Problems:  Hypertension-continue hydrochlorothiazide 12.5 mg daily GERD-continue pantoprazole 80 mg daily Hypertriglyceridemia-diagnosed in hospital, ASCVD 27.8%, started atorvastatin 40 mg daily  FEN/GI: Carb modified PPx: Heparin Dispo: pending clinical improvement   Subjective:  No acute events overnight.  Pain is currently well-controlled.  He does note some issues urinating over the last few months.  States that he feels like his stream comes out in segments and sometimes he has to push on his bladder to fully empty it.  Wonders if it is his prostate.  No dysuria, hematuria.  Objective: Temp:  [97.6 F (36.4 C)-98.4 F (36.9 C)] 97.7 F (36.5 C) (11/16 0516) Pulse Rate:  [57-79] 62 (11/16 0516) Resp:  [10-20] 20 (11/16 0516) BP: (122-167)/(77-98) 122/77 (11/16 0516) SpO2:  [97 %-100 %] 98 % (11/16 0516) Weight:  [83.9 kg] 83.9 kg (11/15 1233) Physical Exam: General: Well-appearing, no acute distress Respiratory: Breathing comfortably on room air, speaking in complete sentences Extremities: Left lower extremity in boot  Laboratory: Most recent CBC Lab Results  Component Value Date   WBC 9.5 02/22/2023   HGB 13.3 02/22/2023   HCT 36.2 (L) 02/22/2023   MCV 87.4 02/22/2023   PLT 220 02/22/2023   Most recent BMP    Latest Ref Rng & Units 02/22/2023    4:50 AM  BMP  Glucose 70 - 99 mg/dL 784   BUN 8 - 23 mg/dL 17   Creatinine 6.96 - 1.24 mg/dL 2.95   Sodium 284 -  145 mmol/L 133   Potassium 3.5 - 5.1 mmol/L 4.0   Chloride 98 - 111 mmol/L 104   CO2 22 - 32 mmol/L 22   Calcium 8.9 - 10.3 mg/dL 8.9      Rayen Dafoe, DO 02/22/2023, 7:08 AM  PGY-1, [facility] FPTS Intern pager: [phone], text pages welcome Secure chat group [facility] Teaching Service

## 2023-02-22 NOTE — Plan of Care (Signed)
  Problem: Health Behavior/Discharge Planning: Goal: Ability to manage health-related needs will improve Outcome: Progressing   Problem: Nutritional: Goal: Maintenance of adequate nutrition will improve Outcome: Progressing   Problem: Skin Integrity: Goal: Risk for impaired skin integrity will decrease Outcome: Progressing   

## 2023-02-22 NOTE — Progress Notes (Signed)
PHARMACY CONSULT NOTE FOR:  OUTPATIENT  PARENTERAL ANTIBIOTIC THERAPY (OPAT)  Indication: Osteomyelitis Regimen: Daptomycin 700 mg IV q24h End date: 03/29/23  IV antibiotic discharge orders are pended. To discharging provider:  please sign these orders via discharge navigator,  Select New Orders & click on the button choice - Manage This Unsigned Work.    Thank you for allowing pharmacy to be a part of this patient's care.  Louie Casa Kayin Kettering 02/22/2023, 1:12 PM

## 2023-02-22 NOTE — Assessment & Plan Note (Addendum)
Reports this been going on for several months, feels like his urinary stream comes in intervals. Suspect this could be 2/2 BPH - flomax 0.4mg  QD - PSA, U/A - bladder scan with post void residual

## 2023-02-22 NOTE — Progress Notes (Signed)
  Subjective:  Patient ID: Tyler Rangel, male    DOB: 12-Jul-1960,  MRN: [id number]  POD #1 irrigation and debridement wound closure tendon transfer right foot  Doing well not having much pain he has been elevating his foot in the chair and staying off of it today  Negative for chest pain and shortness of breath Fever: no Night sweats: no Constitutional signs: no Review of all other systems is negative Objective:   Vitals:   02/22/23 0844 02/22/23 1557  BP: 139/87 108/68  Pulse: 73 86  Resp: 16 16  Temp: 98.5 F (36.9 C) 98.3 F (36.8 C)  SpO2: 95% 96%   General AA&O x3. Normal mood and affect.  Vascular Dorsalis pedis and posterior tibial pulses 2/4 bilat. Brisk capillary refill to all digits. Pedal hair present.  Neurologic Epicritic sensation grossly intact.  Dermatologic Dressing clean dry and intact  Orthopedic: MMT 5/5 in dorsiflexion, plantarflexion, inversion, and eversion. Normal joint ROM without pain or crepitus.   Cultures with staph epi and staph caprae  Pathology 02/18/2023 A. BONE, 5TH METATARSAL BASE, RESECTION:  -  Fragment of articular surface and bone with evidence of degenerative  change and bony remodeling.  -  Negative for acute osteomyelitis on sections examined.   B. BONE, 5TH METATARSAL DISTAL MARGIN, RESECTION:  -  Fragments of bone and articular cartilage with bony remodeling,  chronic osteomyelitis and focal evidence of acute osteomyelitis.  -  The previously inked margin is negative for acute osteomyelitis.   Assessment & Plan:  Patient was evaluated and treated and all questions answered.  Left foot osteomyelitis -Has PICC line going home with IV daptomycin every 24 hours -He has a clear understanding of the importance of blood sugar control.  Discussed with primary team and he will have resources for diabetic teaching for nutrition and resources for glucometer and fingersticks.  Hopefully he can get his diabetes quickly under control and  he certainly seems motivated to do this -Nonweightbearing left lower extremity in cam boot -He will follow-up next Friday with Dr. Annamary Rummage -Dressing may stay intact until outpatient visit  Edwin Cap, DPM  Accessible via secure chat for questions or concerns.

## 2023-02-22 NOTE — Plan of Care (Signed)
  Problem: Coping: Goal: Ability to adjust to condition or change in health will improve Outcome: Progressing   Problem: Health Behavior/Discharge Planning: Goal: Ability to manage health-related needs will improve Outcome: Progressing   Problem: Nutritional: Goal: Maintenance of adequate nutrition will improve Outcome: Progressing

## 2023-02-22 NOTE — Progress Notes (Addendum)
Diagnosis: Hardware associated foot om Dm2  Hardware removed 11/9  Culture Result: mrse, staph caprae 5th metatarsal base; prior cx with mssa    Allergies  Allergen Reactions   Hydrocodone Nausea And Vomiting    OPAT Orders Discharge antibiotics to be given via PICC line Discharge antibiotics: Daptomycin 700 mg q24hr  Duration: 6 weeks from 11/09 End Date: 12/21  [facility] Care Per Protocol:  Home health RN for IV administration and teaching; PICC line care and labs.    Labs weekly while on IV antibiotics: _x_ CBC with differential __ BMP _x_ CMP _x_ CRP __ ESR __ Vancomycin trough _x_ CK  _x_ Please pull PIC at completion of IV antibiotics __ Please leave PIC in place until doctor has seen patient or been notified  Fax weekly labs to [phone]  Clinic Follow Up Appt: 12/3 @ 245pm with dr [facility]  @  [facility] [address], [place], [place] [address] Phone: [phone]

## 2023-02-22 NOTE — Assessment & Plan Note (Addendum)
Chronic osteomyelitis and left foot after complications from surgery to fix left foot fracture in August. S/p bone biopsies, hardware removal on 11/9 and repeat debridement with 5th metatarsal resection on 11/12.  Now postop day 1 irrigation and debridement along with tendon transfer left foot.  Following podiatry and infectious disease recommendations. - Per ID: Daptomycin to cover MRSE, MS staph caprae, prior MSSA - PICC line placed 11/11 - Continue pain regimen:  - Gabapentin 400 TID - Flexeril 5 mg 3 times daily - Tylenol 1000 mg q6h  - Oxycodone 5 q4 PRN for severe pain  - Ibuprofen 600 mg every 6 hours as needed for mild to moderate pain - PT - AM CBC, BMP

## 2023-02-22 NOTE — Assessment & Plan Note (Deleted)
 Triglycerides 375, HDL 27. LDL 50. ASCVD 27.8%. Started statin in hospital. - Continue atorvastatin 40 mg daily

## 2023-02-22 NOTE — Assessment & Plan Note (Addendum)
New onset diabetes, random glucose greater than 300 with polyuria and polydipsia.  A1C 9.0.  1 elevated blood sugar last night at 301 after surgery, however this normalized after mealtime insulin was given. - moderate SSI - CBGs QID - carb modified diet

## 2023-02-23 DIAGNOSIS — M869 Osteomyelitis, unspecified: Secondary | ICD-10-CM | POA: Diagnosis not present

## 2023-02-23 DIAGNOSIS — E119 Type 2 diabetes mellitus without complications: Secondary | ICD-10-CM | POA: Diagnosis not present

## 2023-02-23 DIAGNOSIS — L089 Local infection of the skin and subcutaneous tissue, unspecified: Secondary | ICD-10-CM | POA: Diagnosis not present

## 2023-02-23 LAB — GLUCOSE, CAPILLARY
Glucose-Capillary: 177 mg/dL — ABNORMAL HIGH (ref 70–99)
Glucose-Capillary: 154 mg/dL — ABNORMAL HIGH (ref 70–99)
Glucose-Capillary: 207 mg/dL — ABNORMAL HIGH (ref 70–99)
Glucose-Capillary: 122 mg/dL — ABNORMAL HIGH (ref 70–99)

## 2023-02-23 MED ORDER — METFORMIN HCL ER 500 MG PO TB24: 500.0000 mg | ORAL_TABLET | Freq: Two times a day (BID) | ORAL | 0 refills | Status: AC

## 2023-02-23 MED ORDER — CYCLOBENZAPRINE HCL 5 MG PO TABS: 5.0000 mg | ORAL_TABLET | Freq: Three times a day (TID) | ORAL | 0 refills | Status: DC | PRN

## 2023-02-23 MED ORDER — ACETAMINOPHEN 500 MG PO TABS: 1000.0000 mg | ORAL_TABLET | Freq: Four times a day (QID) | ORAL | 0 refills | Status: AC

## 2023-02-23 MED ORDER — TAMSULOSIN HCL 0.4 MG PO CAPS: 0.4000 mg | ORAL_CAPSULE | Freq: Every day | ORAL | 0 refills | Status: AC

## 2023-02-23 MED ORDER — METFORMIN HCL ER 500 MG PO TB24: 500.0000 mg | ORAL_TABLET | Freq: Two times a day (BID) | ORAL | 0 refills | Status: DC

## 2023-02-23 MED ORDER — IBUPROFEN 600 MG PO TABS: 600.0000 mg | ORAL_TABLET | Freq: Four times a day (QID) | ORAL | 0 refills | Status: AC | PRN

## 2023-02-23 MED ORDER — ATORVASTATIN CALCIUM 40 MG PO TABS: 40.0000 mg | ORAL_TABLET | Freq: Every day | ORAL | 0 refills | Status: AC

## 2023-02-23 NOTE — Assessment & Plan Note (Signed)
New onset diabetes, random glucose greater than 300 with polyuria and polydipsia.  A1C 9.0.  1 elevated blood sugar last night at 301 after surgery, however this normalized after mealtime insulin was given. - moderate SSI - CBGs QID - carb modified diet

## 2023-02-23 NOTE — Assessment & Plan Note (Signed)
Chronic osteomyelitis and left foot after complications from surgery to fix left foot fracture in August. S/p bone biopsies, hardware removal on 11/9 and repeat debridement with 5th metatarsal resection on 11/12.  Now postop day 2 irrigation and debridement along with tendon transfer left foot.  Following podiatry and infectious disease recommendations. - Per ID: Daptomycin to cover MRSE, MS staph caprae, prior MSSA - PICC line placed 11/11 - Continue pain regimen:  - Gabapentin 400 TID - Flexeril 5 mg 3 times daily - Tylenol 1000 mg q6h  - Ibuprofen 600 mg every 6 hours as needed for mild to moderate pain - PT -Plan for discharge home tomorrow with IV antibiotics

## 2023-02-23 NOTE — Progress Notes (Signed)
Daily Progress Note Intern Pager: [phone]  Patient name: Tyler Rangel Medical record number: [id number] Date of birth: Mar 12, 1961 Age: 62 y.o. Gender: male  Primary Care Provider: Jerrye Bushy, FNP Consultants: Podiatry, ID Code Status: Full  Pt Overview and Major Events to Date:  11/7: Admitted to FMTS 11/9: Bone biopsy/hardware removal of left foot 11/12: Repeat debridement, resection of 5th metatarsal 11/15: repeat irrigation and debridement L foot, tendon transfer  Assessment and Plan: 62 year old male with history of hypertension, GERD who was admitted for chronic osteomyelitis in the setting of left foot surgery several months ago.  Patient has had the above procedures and is postop day 2 from repeat irrigation and debridement and tendon transfer of left foot.  Assessment & Plan Osteomyelitis (HCC) Chronic osteomyelitis and left foot after complications from surgery to fix left foot fracture in August. S/p bone biopsies, hardware removal on 11/9 and repeat debridement with 5th metatarsal resection on 11/12.  Now postop day 2 irrigation and debridement along with tendon transfer left foot.  Following podiatry and infectious disease recommendations. - Per ID: Daptomycin to cover MRSE, MS staph caprae, prior MSSA - PICC line placed 11/11 - Continue pain regimen:  - Gabapentin 400 TID - Flexeril 5 mg 3 times daily - Tylenol 1000 mg q6h  - Ibuprofen 600 mg every 6 hours as needed for mild to moderate pain - PT -Plan for discharge home tomorrow with IV antibiotics Diabetes mellitus, new onset (HCC) New onset diabetes, random glucose greater than 300 with polyuria and polydipsia.  A1C 9.0.  1 elevated blood sugar last night at 301 after surgery, however this normalized after mealtime insulin was given. - moderate SSI - CBGs QID - carb modified diet Urinary hesitancy Reports this been going on for several months, feels like his urinary stream comes in intervals. Suspect this  could be 2/2 BPH.  Postvoid residual volume 14 mL.  PSA WNL.  UA with 30 protein. - flomax 0.4mg  every day -doing well on this per patient    Chronic and Stable Issues: Chronic and Stable Problems:  Hypertension-continue hydrochlorothiazide 12.5 mg daily GERD-continue pantoprazole 80 mg daily Hypertriglyceridemia-diagnosed in hospital, ASCVD 27.8%, started atorvastatin 40 mg daily  FEN/GI: Carb modified PPx: Heparin Dispo: Likely home tomorrow  Subjective:  Patient is doing well, anxious to get home, no complaints.  Objective: Temp:  [97.8 F (36.6 C)-99.1 F (37.3 C)] 97.8 F (36.6 C) (11/17 0733) Pulse Rate:  [63-86] 63 (11/17 0733) Resp:  [16] 16 (11/17 0505) BP: (99-119)/(58-84) 118/63 (11/17 0733) SpO2:  [96 %-99 %] 98 % (11/17 0733) Physical Exam: General: Patient sitting up in chair, eating breakfast, pleasant Cardiovascular: RRR, normal S1/S2 Respiratory: Breathing comfortably on room air Extremities: Left foot wrapped in Ace bandage and covered in boot  Laboratory: Most recent CBC Lab Results  Component Value Date   WBC 9.5 02/22/2023   HGB 13.3 02/22/2023   HCT 36.2 (L) 02/22/2023   MCV 87.4 02/22/2023   PLT 220 02/22/2023   Most recent BMP    Latest Ref Rng & Units 02/22/2023    4:50 AM  BMP  Glucose 70 - 99 mg/dL 841   BUN 8 - 23 mg/dL 17   Creatinine 3.24 - 1.24 mg/dL 4.01   Sodium 027 - 253 mmol/L 133   Potassium 3.5 - 5.1 mmol/L 4.0   Chloride 98 - 111 mmol/L 104   CO2 22 - 32 mmol/L 22   Calcium 8.9 - 10.3 mg/dL 8.9  Erick Alley, DO 02/23/2023, 12:53 PM  PGY-3, [facility] [place] FPTS Intern pager: [phone], text pages welcome Secure chat group [facility] Day [facility] Teaching Service

## 2023-02-23 NOTE — Assessment & Plan Note (Signed)
Reports this been going on for several months, feels like his urinary stream comes in intervals. Suspect this could be 2/2 BPH.  Postvoid residual volume 14 mL.  PSA WNL.  UA with 30 protein. - flomax 0.4mg  every day -doing well on this per patient

## 2023-02-23 NOTE — TOC Progression Note (Signed)
Transition of Care [facility]) - Progression Note    Patient Details  Name: Tonny Boisseau MRN: [id number] Date of Birth: 1960-07-03  Transition of Care [facility]) CM/SW Contact  Ronny Bacon, RN Phone Number: 02/23/2023, 10:58 AM  Clinical Narrative:  Secure message received from provider regarding patient needing PCP. Patient has [employer] medical coverage. Spoke with patient by phone and gave instructions of going onto insurance company website to see who accepts his insurance. Patient verbalizes understanding.          Expected Discharge Plan and Services   Discharge Planning Services: CM Consult                     DME Arranged: Other see comment (IV ABX therapy)   Date DME Agency Contacted: 02/18/23 Time DME Agency Contacted: 1119 Representative spoke with at DME Agency: Pam HH Arranged: RN HH Agency: Other - See comment ([facility]) Date HH Agency Contacted: 02/18/23 Time HH Agency Contacted: 1119 Representative spoke with at [facility] Agency: Pam   Social Determinants of Health (SDOH) Interventions SDOH Screenings   Food Insecurity: No Food Insecurity (02/13/2023)  Housing: Low Risk  (02/13/2023)  Transportation Needs: No Transportation Needs (02/13/2023)  Utilities: Not At Risk (02/13/2023)  Depression (PHQ2-9): Low Risk  (02/10/2023)  Tobacco Use: Low Risk  (02/21/2023)    Readmission Risk Interventions     No data to display

## 2023-02-23 NOTE — Progress Notes (Signed)
Mobility Specialist: Progress Note   02/23/23 1605  Mobility  Activity Ambulated independently in hallway  Level of Assistance Modified independent, requires aide device or extra time  Assistive Device Front wheel walker  Distance Ambulated (ft) 150 ft  LLE Weight Bearing NWB  Activity Response Tolerated well  Mobility Referral Yes  $Mobility charge 1 Mobility  Mobility Specialist Start Time (ACUTE ONLY) 1427  Mobility Specialist Stop Time (ACUTE ONLY) 1441  Mobility Specialist Time Calculation (min) (ACUTE ONLY) 14 min    Received pt in chair and agreeable to mobility. Had c/o R arm soreness but otherwise asymptomatic. Pt was asymptomatic throughout ambulation and returned to room w/o fault. Left in chair w/ call bell in reach and all needs met.   Maurene Capes Mobility Specialist Please contact via SecureChat or Rehab office at [phone]

## 2023-02-23 NOTE — Plan of Care (Signed)
  Problem: Education: Goal: Ability to describe self-care measures that may prevent or decrease complications (Diabetes Survival Skills Education) will improve Outcome: Progressing   Problem: Activity: Goal: Risk for activity intolerance will decrease Outcome: Progressing   Problem: Safety: Goal: Ability to remain free from injury will improve Outcome: Progressing

## 2023-02-24 ENCOUNTER — Encounter (HOSPITAL_COMMUNITY): Payer: Self-pay | Admitting: Certified Registered Nurse Anesthetist

## 2023-02-24 DIAGNOSIS — E119 Type 2 diabetes mellitus without complications: Secondary | ICD-10-CM | POA: Diagnosis not present

## 2023-02-24 DIAGNOSIS — M869 Osteomyelitis, unspecified: Secondary | ICD-10-CM | POA: Diagnosis not present

## 2023-02-24 DIAGNOSIS — L089 Local infection of the skin and subcutaneous tissue, unspecified: Secondary | ICD-10-CM | POA: Diagnosis not present

## 2023-02-24 LAB — GLUCOSE, CAPILLARY
Glucose-Capillary: 127 mg/dL — ABNORMAL HIGH (ref 70–99)
Glucose-Capillary: 104 mg/dL — ABNORMAL HIGH (ref 70–99)

## 2023-02-24 LAB — SURGICAL PATHOLOGY

## 2023-02-24 NOTE — Discharge Planning (Signed)
Patient alert and oriented. PICC line in place per discharge requirements fo Home IV antibiotics. IV antibiotic teaching given to patient and wife and was completed by Pam at [facility] infusion. Discharge teaching provided by nurse. Patient transported to lobby via nurse for private transportation home.

## 2023-02-24 NOTE — Assessment & Plan Note (Addendum)
New onset diabetes, random glucose greater than 300 with polyuria and polydipsia.  A1C 9.0.   - moderate SSI - CBGs QID - carb modified diet - plan for metformin at discharge

## 2023-02-24 NOTE — Addendum Note (Signed)
Addendum  created 02/24/23 0930 by Dorie Rank, CRNA   Intraprocedure Meds edited

## 2023-02-24 NOTE — Discharge Summary (Signed)
[facility]  Family Medicine Teaching [facility] Discharge Summary  Patient name: Tyler Rangel Medical record number: [id number] Date of birth: Oct 06, 1960 Age: 62 y.o. 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 His hospital course is detailed below:  Chronic osteomyelitis to L foot Patient presented with ongoing left foot pain in the setting of chronic osteomyelitis with prior injuries, procedures, and antibiotics. Did not meet sepsis/SIRS criteria at admission. Hardware removal and bone biopsies were performed on 11/10.  Repeat debridement, resection of base of fifth metatarsal on 11/12.  Repeat irrigation and debridement of left foot with tendon transfer on 11/15.  Staph caprae, Staph epidermidis, and GNR found on surgical culture. Per ID, patient was started on Cefazolin, transitioned to daptomycin and cefepime, discharged on daptomycin alone via PICC line. Patient pain well controlled throughout admission. Remains non-weight bearing with boot in place at discharge.   New onset type 2 diabetes Random glucose greater than 300 and A1c of 9.1. previously undiagnosed DM may have contributed repeated infections and chronic osteomyelitis. Patient received SSI  during admission with largely stable BG in mid 100s. Patient was discharged on regimen of metformin 500 BID.   Hypertriglyceridemia Lipid panel with triglycerides 375, total 152, HDL 27, LDL 50. Calculated ASCVD risk of 27%.  Patient was started on Lipitor 40mg  during admission.   Urinary hesitancy  Reported months of difficulty with urination, noting difficulty emptying with intermittent segments of urination. No dysuria, hematuria. Postvoid residual volume 14 mL.  PSA WNL.  UA with 30 protein. Started on Flomax 0.4mg  daily. Symptoms improved by time of discharge.   Other chronic conditions were medically managed with home medications and formulary alternatives as necessary (HTN, GERD)  PCP Follow-up Recommendations: CBC, BMP at follow-up Diabetes education for new diagnosis Fu asymptomatic bradycardia throughout admission  Fu podiatry and ID recs Patient concerned about [facility] of hemochromatosis - consider labs  Disposition: Home with home health for IV antibiotics via PICC line  Discharge Condition: Stable  Discharge Exam:  Vitals:   02/24/23 0438 02/24/23 0721  BP: 118/72 135/86  Pulse: (!) 55 (!) 50  Resp: 18 14  Temp: (!) 97.5 F (36.4 C) 97.8 F (36.6 C)  SpO2: 98% 100%   General: Well-appearing. Resting comfortably in room. CV: Normal S1/S2. No extra heart sounds. Warm and well-perfused. Pulm: Breathing comfortably on room air. CTAB. No increased WOB. Abd: Soft, non-tender, non-distended. Ext: Well-wrapped, warm L foot with normal sensation and cap refill <2 seconds.  Skin:  Warm, dry. Psych: Pleasant and appropriate.   Significant Procedures:  11/9: Bone biopsy/hardware removal of left foot 11/12: Repeat debridement, resection of 5th metatarsal 11/15: Repeat irrigation and debridement L foot, tendon transfer  Significant Labs and Imaging:  Lab Results  Component Value Date   WBC 9.5 02/22/2023  HGB 13.3 02/22/2023   HCT 36.2 (L) 02/22/2023   MCV 87.4  02/22/2023   PLT 220 02/22/2023      Latest Ref Rng & Units 02/22/2023    4:50 AM 02/21/2023    1:08 AM 02/20/2023    3:32 AM  BMP  Glucose 70 - 99 mg/dL 366  440  347   BUN 8 - 23 mg/dL 17  16  18    Creatinine 0.61 - 1.24 mg/dL 4.25  9.56  3.87   Sodium 135 - 145 mmol/L 133  137  134   Potassium 3.5 - 5.1 mmol/L 4.0  3.4  3.3   Chloride 98 - 111 mmol/L 104  106  104   CO2 22 - 32 mmol/L 22  23  23    Calcium 8.9 - 10.3 mg/dL 8.9  9.0  8.6     DG L foot (11/12): 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 Antibiotic beads present.  Results/Tests Pending at Time of Discharge: none  Discharge Medications:  Allergies as of 02/24/2023       Reactions   Hydrocodone Nausea And Vomiting        Medication List     STOP taking these medications    meloxicam 15 MG tablet Commonly known as: MOBIC   oxyCODONE-acetaminophen 5-325 MG tablet Commonly known as: Percocet       TAKE these medications    acetaminophen 500 MG tablet Commonly known as: TYLENOL Take 2 tablets (1,000 mg total) by mouth every 6 (six) hours.   atorvastatin 40 MG tablet Commonly known as: LIPITOR Take 1 tablet (40 mg total) by mouth daily.   cyclobenzaprine 5 MG tablet Commonly known as: FLEXERIL Take 1 tablet (5 mg total) by mouth 3 (three) times daily as needed for muscle spasms.   daptomycin IVPB Commonly known as: CUBICIN Inject 700 mg into the vein daily. Indication:  Osteomyelitis First Dose: Yes Last Day of Therapy:  03/29/23 Labs - Once weekly:  CBC/D, BMP, and CPK Labs - Once weekly: ESR and CRP Method of administration: IV Push Method of administration may be changed at the discretion of home infusion pharmacist based upon assessment of the patient and/or caregiver's ability to self-administer the medication ordered.   esomeprazole 40 MG capsule Commonly known as: NEXIUM Take 40 mg by mouth daily.   gabapentin 400 MG capsule Commonly known as:  Neurontin Take 1 capsule (400 mg total) by mouth 3 (three) times daily.   hydrochlorothiazide 12.5 MG capsule Commonly known as: MICROZIDE Take 12.5 mg by mouth daily.   ibuprofen 600 MG tablet Commonly known as: ADVIL Take 1 tablet (600 mg total) by mouth every 6 (six) hours as needed for mild pain (pain score 1-3) or moderate pain (pain score 4-6). What changed:  when to take this reasons to take this   loratadine 10 MG tablet Commonly known as: CLARITIN Take 10 mg by mouth daily.   metFORMIN 500 MG 24 hr tablet Commonly known as: GLUCOPHAGE-XR Take 1 tablet (500 mg total) by mouth 2 (two) times daily with a meal.   sildenafil 25 MG tablet Commonly known as: VIAGRA Take 25-50 mg by mouth daily as needed.   tamsulosin 0.4 MG Caps capsule Commonly known as: FLOMAX Take 1 capsule (0.4 mg total) by mouth daily.   Vitamin D (Ergocalciferol) 1.25 MG (50000 UNIT) Caps capsule Commonly known as: DRISDOL Take 1 capsule (50,000 Units total) by mouth every 7 (seven) days for 6 doses.  Discharge Care Instructions  (From admission, onward)           Start     Ordered   02/24/23 0000  Discharge wound care:       Comments: NO dressing changes until your see your podiatrist. Please remain non weightbearing in your post-op boot.   02/24/23 1144   02/22/23 0000  Change dressing on IV access line weekly and PRN  (Home infusion instructions - [facility] )        02/22/23 1322            Discharge Instructions: Please refer to Patient Instructions section of EMR for full details.  Patient was counseled important signs and symptoms that should prompt return to medical care, changes in medications, dietary instructions, activity restrictions, and follow up appointments.   Follow-Up Appointments:  Follow-up Information     Jerrye Bushy, FNP Follow up.   Specialty: Family Medicine Contact information: [address] [place]  [address] [phone]         [facility] Follow up.   Why: home infusion therapy will be provided by [facility]. Questions, please call [phone]                Ivery Quale, MD 02/24/2023, 12:59 PM PGY-1, [facility]

## 2023-02-24 NOTE — Progress Notes (Signed)
Physical Therapy Treatment Patient Details Name: Tyler Rangel MRN: [id number] DOB: [birth year] Today's Date: 02/24/2023   History of Present Illness Tyler Rangel is a 62 year old male  left foot pain in the setting of chronic osteomyelitis secondary to left foot surgery several months ago; s/p removal of deep hardware and open biopsy on 11/9. S/p I&D, resection of L 5th metatarsal w/ antibiotic bead placement on 11/13. PMH of HTN, GERD    PT Comments  Pt given handout of LE there ex and given ideas for progression of exercises at home to make them more difficult. Pt performed sitting and standing exercises in chair and at counter in room. Pt ambulated 79' with RW and supervision. Education given on step length to avoid posterior LOB, pt demonstrated understanding. Plan is for d/c home later today. Pt is at appropriate level from mobility standpoint to do this.     If plan is discharge home, recommend the following: Assist for transportation;Assistance with cooking/housework   Can travel by private vehicle        Equipment Recommendations  None recommended by PT    Recommendations for Other Services       Precautions / Restrictions Precautions Precautions: None Required Braces or Orthoses: Other Brace (L CAM boot w/ OOB mobility) Restrictions Weight Bearing Restrictions: Yes LLE Weight Bearing: Non weight bearing Other Position/Activity Restrictions: in cam boot     Mobility  Bed Mobility Overal bed mobility: Independent                  Transfers Overall transfer level: Needs assistance Equipment used: Rolling walker (2 wheels) Transfers: Sit to/from Stand Sit to Stand: Supervision           General transfer comment: supervision for safety, pt moves quickly and noted that he lost his balance earlier today and had to put L foot down, educated on timing and safety    Ambulation/Gait Ambulation/Gait assistance: Supervision Gait Distance (Feet): 60 Feet Assistive  device: Rolling walker (2 wheels) Gait Pattern/deviations: Step-to pattern Gait velocity: WFL for SL stance gait pattern Gait velocity interpretation: >2.62 ft/sec, indicative of community ambulatory   General Gait Details: step to gait pattern, pt able to maintain NWB precautions well. Steady w/ no LOB. Education given on not hopping past line of hands to keep balance fwd   Stairs             Wheelchair Mobility     Tilt Bed    Modified Rankin (Stroke Patients Only)       Balance Overall balance assessment: Needs assistance Sitting-balance support: Feet supported, No upper extremity supported Sitting balance-Leahy Scale: Good     Standing balance support: Bilateral upper extremity supported, During functional activity, Reliant on assistive device for balance Standing balance-Leahy Scale: Fair Standing balance comment: pt able to stand w/ no AD, reliant on AD for mobility                            Cognition Arousal: Alert Behavior During Therapy: WFL for tasks assessed/performed Overall Cognitive Status: Within Functional Limits for tasks assessed                                          Exercises General Exercises - Lower Extremity Ankle Circles/Pumps:  (pt to ask MD if he can take CAM boot off and do  AP's) Quad Sets: AROM, Both, 10 reps, Seated Long Arc Quad: AROM, Seated, Left, 10 reps (w/ 3 sec hold) Heel Slides: AROM, Left, 10 reps, Seated Hip ABduction/ADduction: AROM, Left, 10 reps, Standing Straight Leg Raises: AROM, Left, 10 reps, Seated Hip Flexion/Marching: AROM, Both, 15 reps, Seated Other Exercises Other Exercises: LE ther ex handout given for home    General Comments        Pertinent Vitals/Pain Pain Assessment Pain Assessment: Faces Faces Pain Scale: Hurts little more Pain Location: L foot Pain Descriptors / Indicators: Grimacing, Guarding Pain Intervention(s): Limited activity within patient's tolerance,  Monitored during session    Home Living                          Prior Function            PT Goals (current goals can now be found in the care plan section) Acute Rehab PT Goals Patient Stated Goal: Wants to be done battling this infection, and for the L foot to heal PT Goal Formulation: With patient Time For Goal Achievement: 03/02/23 Potential to Achieve Goals: Good Progress towards PT goals: Progressing toward goals    Frequency    Min 1X/week      PT Plan      Co-evaluation              AM-PAC PT "6 Clicks" Mobility   Outcome Measure  Help needed turning from your back to your side while in a flat bed without using bedrails?: None Help needed moving from lying on your back to sitting on the side of a flat bed without using bedrails?: None Help needed moving to and from a bed to a chair (including a wheelchair)?: A Little Help needed standing up from a chair using your arms (e.g., wheelchair or bedside chair)?: A Little Help needed to walk in hospital room?: A Little Help needed climbing 3-5 steps with a railing? : A Little 6 Click Score: 20    End of Session Equipment Utilized During Treatment: Gait belt Activity Tolerance: Patient tolerated treatment well Patient left: in chair;with call bell/phone within reach Nurse Communication: Mobility status PT Visit Diagnosis: Other abnormalities of gait and mobility (R26.89)     Time: 6213-0865 PT Time Calculation (min) (ACUTE ONLY): 21 min  Charges:    $Therapeutic Exercise: 8-22 mins PT General Charges $$ ACUTE PT VISIT: 1 Visit                     Lyanne Co, PT  Acute Rehab Services Secure chat preferred Office [phone]    Benetta Spar L Inella Kuwahara 02/24/2023, 11:12 AM

## 2023-02-24 NOTE — TOC Transition Note (Addendum)
Transition of Care [facility]) - CM/SW Discharge Note   Patient Details  Name: Tyler Rangel MRN: [id number] Date of Birth: [birth year]  Transition of Care [facility]) CM/SW Contact:  Epifanio Lesches, RN Phone Number: 02/24/2023, 11:57 AM   Clinical Narrative:    Patient will DC to: Home Anticipated DC date: 02/24/2023 Family notified: yes Transport by: car  Per MD patient ready for DC today. RN, patient, patient's wife and  Pam/[facility] aware of DC plan. Pam to do home infusion teaching with pt and wife prior to d/c this afternoon @ bedside. Pt to receive afternoon ABX therapy prior to discharge.  Post hospital f/u noted on AVS. Wife to provide transportation to home. Pt without RX med concerns.  RNCM will sign off for now as intervention is no longer needed. Please consult [place] again if new needs arise.    Final next level of care: Home w Home Health Services Barriers to Discharge: No Barriers Identified   Patient Goals and CMS Choice   Choice offered to / list presented to : Patient  Discharge Placement                         Discharge Plan and Services Additional resources added to the After Visit Summary for     Discharge Planning Services: CM Consult            DME Arranged: Other see comment (IV ABX therapy)   Date DME Agency Contacted: 02/18/23 Time DME Agency Contacted: 1119 Representative spoke with at DME Agency: Pam HH Arranged: RN HH Agency: Other - See comment ([facility]) Date HH Agency Contacted: 02/18/23 Time HH Agency Contacted: 1119 Representative spoke with at [facility] Agency: Pam  Social Determinants of Health (SDOH) Interventions SDOH Screenings   Food Insecurity: No Food Insecurity (02/13/2023)  Housing: Low Risk  (02/13/2023)  Transportation Needs: No Transportation Needs (02/13/2023)  Utilities: Not At Risk (02/13/2023)  Depression (PHQ2-9): Low Risk  (02/10/2023)  Tobacco Use: Low Risk  (02/21/2023)     Readmission  Risk Interventions     No data to display

## 2023-02-24 NOTE — Plan of Care (Signed)
  Problem: Metabolic: Goal: Ability to maintain appropriate glucose levels will improve Outcome: Progressing   Problem: Nutritional: Goal: Maintenance of adequate nutrition will improve Outcome: Progressing   Problem: Elimination: Goal: Will not experience complications related to bowel motility Outcome: Progressing

## 2023-02-24 NOTE — Inpatient Diabetes Management (Signed)
Inpatient Diabetes Program Recommendations  AACE/ADA: New Consensus Statement on Inpatient Glycemic Control (2015)  Target Ranges:  Prepandial:   less than 140 mg/dL      Peak postprandial:   less than 180 mg/dL (1-2 hours)      Critically ill patients:  140 - 180 mg/dL   Lab Results  Component Value Date   GLUCAP 104 (H) 02/24/2023   HGBA1C 9.0 (H) 02/14/2023    Review of Glycemic Control  Latest Reference Range & Units 02/23/23 11:39 02/23/23 16:44 02/23/23 21:38 02/24/23 07:22 02/24/23 11:11  Glucose-Capillary 70 - 99 mg/dL 161 (H) 096 (H) 045 (H) 127 (H) 104 (H)  (H): Data is abnormally high  Diabetes history: New onset DM2 Outpatient Diabetes medications: None Current orders for Inpatient glycemic control: Novolog 0-15 units TID  Referral for discharge recommendations.  New diagnosis of DM2.  Spoke with him and educated him on 02/14/23.  Has only needed correction insulin for glucose trends.  Might consider Metformin 500 mg BID with meals and Amaryl 2 mg QAM with breakfast at discharge and follow up with PCP.    Thank you, Dulce Sellar, MSN, CDCES Diabetes Coordinator Inpatient Diabetes Program [phone] (team pager from 8a-5p)

## 2023-02-24 NOTE — Assessment & Plan Note (Addendum)
Chronic osteomyelitis and left foot after complications from surgery to fix left foot fracture in August. S/p bone biopsies, hardware removal on 11/9 and repeat debridement with 5th metatarsal resection on 11/12.  Now postop day 3 irrigation and debridement along with tendon transfer left foot (11/15).  Following podiatry and infectious disease recommendations. Patient doing well with adequate pain control.  - Per ID: Daptomycin to cover MRSE, MS staph caprae, prior MSSA - PICC line placed 11/11 - Continue pain regimen:  - Gabapentin 400 TID - Flexeril 5 mg 3 times daily - Tylenol 1000 mg q6h  - Ibuprofen 600 mg every 6 hours as needed for mild to moderate pain -PT following -Plan for discharge home today with IV antibiotics

## 2023-02-24 NOTE — Assessment & Plan Note (Addendum)
Reports this been going on for several months, feels like his urinary stream comes in intervals. Suspect this could be 2/2 BPH.  Postvoid residual volume 14 mL.  PSA WNL.  UA with 30 protein. Symptoms improved during admission with reported return to normal urination.  - continue flomax 0.4mg  every day

## 2023-02-24 NOTE — Progress Notes (Addendum)
Daily Progress Note Intern Pager: [phone]  Patient name: Tyler Rangel Medical record number: [id number] Date of birth: Jan 09, 1961 Age: 62 y.o. Gender: male  Primary Care Provider: Jerrye Bushy, FNP Consultants: Podiatry, ID Code Status: Full   Pt Overview and Major Events to Date:  11/7: Admitted to [facility] 11/9: Bone biopsy/hardware removal of left foot 11/12: Repeat debridement, resection of 5th metatarsal 11/15: repeat irrigation and debridement L foot, tendon transfer Assessment and Plan:  62 year old male with history of hypertension, GERD who was admitted for chronic osteomyelitis in the setting of left foot surgery several months ago.  Patient has had the above procedures and is postop day 3 from repeat irrigation and debridement and tendon transfer of left foot. Medically stable for discharge.   Assessment & Plan Osteomyelitis (HCC) Chronic osteomyelitis and left foot after complications from surgery to fix left foot fracture in August. S/p bone biopsies, hardware removal on 11/9 and repeat debridement with 5th metatarsal resection on 11/12.  Now postop day 3 irrigation and debridement along with tendon transfer left foot (11/15).  Following podiatry and infectious disease recommendations. Patient doing well with adequate pain control.  - Per ID: Daptomycin to cover MRSE, MS staph caprae, prior MSSA - PICC line placed 11/11 - Continue pain regimen:  - Gabapentin 400 TID - Flexeril 5 mg 3 times daily - Tylenol 1000 mg q6h  - Ibuprofen 600 mg every 6 hours as needed for mild to moderate pain -PT following -Plan for discharge home today with IV antibiotics Diabetes mellitus, new onset (HCC) New onset diabetes, random glucose greater than 300 with polyuria and polydipsia.  A1C 9.0.   - moderate SSI - CBGs QID - carb modified diet - plan for metformin at discharge Urinary hesitancy Reports this been going on for several months, feels like his urinary stream comes in  intervals. Suspect this could be 2/2 BPH.  Postvoid residual volume 14 mL.  PSA WNL.  UA with 30 protein. Symptoms improved during admission with reported return to normal urination.  - continue flomax 0.4mg  every day    Chronic and Stable Issues: Hypertension-continue hydrochlorothiazide 12.5 mg daily GERD-continue pantoprazole 80 mg daily Hypertriglyceridemia: continue Lipitor 40 daily  FEN/GI: Carb modified PPx: Heparin Dispo: Likely home today   Subjective:  Patient doing well and ready to return home. Pain well-controlled. No chest pain, SOB, headache. Tolerating PO well. Reports normal BM this morning.   Objective: Temp:  [97.5 F (36.4 C)-98.1 F (36.7 C)] 97.5 F (36.4 C) (11/18 0438) Pulse Rate:  [50-63] 50 (11/18 0721) Resp:  [18] 18 (11/18 0438) BP: (109-135)/(63-86) 135/86 (11/18 0721) SpO2:  [97 %-100 %] 100 % (11/18 0721) Physical Exam: General: Well-appearing. Resting comfortably in room. CV: Normal S1/S2. No extra heart sounds. Warm and well-perfused. Pulm: Breathing comfortably on room air. CTAB. No increased WOB. Abd: Soft, non-tender, non-distended. Ext: Warm L foot with normal sensation and cap refill <2 seconds.  Skin:  Warm, dry. Psych: Pleasant and appropriate.    Laboratory: Most recent CBC Lab Results  Component Value Date   WBC 9.5 02/22/2023   HGB 13.3 02/22/2023   HCT 36.2 (L) 02/22/2023   MCV 87.4 02/22/2023   PLT 220 02/22/2023   Most recent BMP    Latest Ref Rng & Units 02/22/2023    4:50 AM  BMP  Glucose 70 - 99 mg/dL 147   BUN 8 - 23 mg/dL 17   Creatinine 8.29 - 1.24 mg/dL 5.62  Sodium 135 - 145 mmol/L 133   Potassium 3.5 - 5.1 mmol/L 4.0   Chloride 98 - 111 mmol/L 104   CO2 22 - 32 mmol/L 22   Calcium 8.9 - 10.3 mg/dL 8.9      Ivery Quale, MD 02/24/2023, 7:23 AM  PGY-1, [facility] FPTS Intern pager: [phone], text pages welcome Secure chat group [facility] Teaching Service

## 2023-02-25 ENCOUNTER — Encounter (HOSPITAL_COMMUNITY): Payer: Self-pay | Admitting: Podiatry

## 2023-02-26 ENCOUNTER — Encounter (HOSPITAL_COMMUNITY): Payer: Self-pay | Admitting: Podiatry

## 2023-03-07 ENCOUNTER — Other Ambulatory Visit: Payer: Self-pay | Admitting: Podiatry

## 2023-03-09 NOTE — Telephone Encounter (Signed)
Approved renewal for Vitamin D2

## 2023-03-10 ENCOUNTER — Encounter: Payer: 59 | Attending: Physical Medicine and Rehabilitation | Admitting: Physical Medicine and Rehabilitation

## 2023-03-10 ENCOUNTER — Encounter: Payer: Self-pay | Admitting: Physical Medicine and Rehabilitation

## 2023-03-10 VITALS — BP 109/73 | HR 79 | Ht 66.0 in | Wt 181.0 lb

## 2023-03-10 DIAGNOSIS — G8929 Other chronic pain: Secondary | ICD-10-CM

## 2023-03-10 DIAGNOSIS — R269 Unspecified abnormalities of gait and mobility: Secondary | ICD-10-CM | POA: Diagnosis present

## 2023-03-10 DIAGNOSIS — Z5181 Encounter for therapeutic drug level monitoring: Secondary | ICD-10-CM

## 2023-03-10 DIAGNOSIS — M79672 Pain in left foot: Secondary | ICD-10-CM | POA: Insufficient documentation

## 2023-03-10 DIAGNOSIS — Z029 Encounter for administrative examinations, unspecified: Secondary | ICD-10-CM | POA: Diagnosis present

## 2023-03-10 DIAGNOSIS — M8620 Subacute osteomyelitis, unspecified site: Secondary | ICD-10-CM

## 2023-03-10 DIAGNOSIS — G894 Chronic pain syndrome: Secondary | ICD-10-CM

## 2023-03-10 NOTE — Patient Instructions (Signed)
Re-wrapped foot, consulted with Podiatry in clinic, getting scheduled for staple removal later this week  Pain well controlled on current regimen, follow up in 2 months

## 2023-03-10 NOTE — Progress Notes (Signed)
Subjective:    Patient ID: Tyler Rangel, male    DOB: June 07, 1960, 62 y.o.   MRN: [id number]  HPI  Tyler Rangel is a 61 y.o. year old male  who  has a past medical history of Diabetes mellitus without complication (HCC) and GERD (gastroesophageal reflux disease).   They are presenting to PM&R clinic for follow up related to  L lateral foot pain s/p ORIF and subsequent infection of 5th metatarsal .  .  Plan from last visit:  Chronic pain syndrome Encounter for therapeutic drug monitoring Encounter for opiate analgesic use agreement Chronic foot pain, left Chronic bilateral low back pain without sciatica   Today, you signed a pain contract and performed a urine drug screen.  I will refill your Percocet 5 mg 1 tab daily as needed for 1 month, then have you follow-up with me in clinic. We discussed using percocet before prolonged standing activities like church instead of after.     If you need additional medication in the immediate postop period, your surgeon is welcome to direct this and I will not restrict you as a result of our pain contract.  We will follow back up on this in 1 month, but let me know if you have any questions.   Today, we got lab works to look at your kidney and liver function.     Depending on the results, we may increase your gabapentin to 400 mg 3 times daily.  I will message you through MyChart regarding this decision.  We also discussed transitioning to Lyrica, but will defer at this time since you are doing so well with gabapentin.   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   Abnormality of gait and mobility Please follow all instructions by your surgeon for weightbearing restrictions both pre and postop.    We will hold off on  further referrals for physical therapy, possible Qutenza patch, and possible peripheral nerve stimulator until after your operation on 11-20.   Other orders -     oxyCODONE-Acetaminophen; Take 1 tablet by mouth daily as needed for severe pain (pain score 7-10) (Try to take 20-30 minutes prior to exacerbating activities).  Dispense: 20 tablet; Refill: 0   Addendum: Lab work showed normal GFR, significant for elevated ALP which is likely secondary to ongoing ankle issues and severely elevated blood glucose of 269.  No documented history of diabetes per the chart, message to patient and ordered hemoglobin A1c, and and forwarding information to PCP for follow-up.        Interval Hx:    - Follow ups: Went to ER 11/7 for hardware infection/OM with new Dx DM2; has had hardware removal 11/9 an dI&D 11/12, 11/15 with abx beads with Dr. Annamary Rummage. Patient also reports he moved the tendon and reattached it; patient states the pain has been severe since that surgery. It's been over 2 weeks and has not heard from his surgeon since then.  Has appt with infectious disease tomorrow.    - Falls: None   - DME: Currently in cam boot, but is enjoying ambulating more.   - Medications: He thinks metformin was making him sick "I was breaking out in cold sweat and couldn't even get out of bed."  Has been taking flexeril three times daily for muscle spasms in his legs.   Reduced gabapentin to 300 mg TID because 400 mg capsules caused him to choke/he was unable to swallow. He states it is working well and he spaces them out.   He will take ibuprofen 600 mg tabs occassionally Prn; is also on Mobic 15 mg daily.    - Other concerns: Has been nauseas since leaving the hospital; has been adjusting his metformin and using bowel medications since then to try and get it worked out.    Endorses numbness/tingling in his left lateral 3 fingers over the past day; intermittent, goes away, has happened in the past. No  weakness associated.   Pain Inventory Average Pain 10 Pain Right Now 2 My pain is intermittent, sharp, and burning  In the last 24 hours, has pain interfered with the following? General activity 7 Relation with others 0 Enjoyment of life 7 What TIME of day is your pain at its worst? morning , daytime, evening, and night Sleep (in general) Fair  Pain is worse with: walking, bending, sitting, standing, and some activites Pain improves with: medication Relief from Meds: 8  History reviewed. No pertinent family history. Social History   Socioeconomic History   Marital status: Married    Spouse name: Not on file   Number of children: Not on file   Years of education: Not on file   Highest education level: Not on file  Occupational History   Not on file  Tobacco Use   Smoking status: Never   Smokeless tobacco: Never  Vaping Use   Vaping status: Never Used  Substance and Sexual Activity   Alcohol use: Never   Drug use: Never   Sexual activity: Not on file  Other Topics Concern   Not on file  Social History Narrative   Not on file   Social Determinants of Health   Financial Resource Strain: Not on file  Food Insecurity: No Food Insecurity (02/13/2023)   Hunger Vital Sign    Worried About Running Out of Food in the Last Year: Never true    Ran Out of Food in the Last Year: Never true  Transportation Needs: No Transportation Needs (02/13/2023)   PRAPARE - Administrator, Civil Service (Medical): No    Lack of Transportation (Non-Medical): No  Physical Activity: Not on file  Stress: Not on file  Social Connections: Not on file   Past Surgical History:  Procedure Laterality Date   AMPUTATION Left 02/18/2023   Procedure: Repeat irrigation and debridement, resection of 5th metatarsal, antibiotic beads;  Surgeon: Pilar Plate, DPM;  Location: [facility] OR;  Service: Orthopedics/Podiatry;  Laterality: Left;  Left foot resection of 5th metatarsal base, repeat  washout, antibiotic beads stimulan   FOOT SURGERY Left    HARDWARE REMOVAL Left 02/15/2023   Procedure: HARDWARE REMOVAL with BONE BIOPSY.;  Surgeon: Pilar Plate, DPM;  Location: [facility] OR;  Service: Orthopedics/Podiatry;  Laterality: Left;   IRRIGATION AND DEBRIDEMENT FOOT Left 02/21/2023   Procedure: REPEAT IRRIGATION AND DEBRIDEMENT FOOT WITH ANTIBIOTIC BEAD EXCHANGE;  Surgeon: Pilar Plate, DPM;  Location: [facility] OR;  Service: Orthopedics/Podiatry;  Laterality: Left;   TENDON REPAIR Left 02/21/2023   Procedure: TENDON TRANSFER;  Surgeon: Pilar Plate, DPM;  Location: [facility] OR;  Service: Orthopedics/Podiatry;  Laterality: Left;   Past Surgical History:  Procedure Laterality Date   AMPUTATION Left 02/18/2023   Procedure: Repeat irrigation and debridement, resection of 5th  metatarsal, antibiotic beads;  Surgeon: Pilar Plate, DPM;  Location: [facility] OR;  Service: Orthopedics/Podiatry;  Laterality: Left;  Left foot resection of 5th metatarsal base, repeat washout, antibiotic beads stimulan   FOOT SURGERY Left    HARDWARE REMOVAL Left 02/15/2023   Procedure: HARDWARE REMOVAL with BONE BIOPSY.;  Surgeon: Pilar Plate, DPM;  Location: [facility] OR;  Service: Orthopedics/Podiatry;  Laterality: Left;   IRRIGATION AND DEBRIDEMENT FOOT Left 02/21/2023   Procedure: REPEAT IRRIGATION AND DEBRIDEMENT FOOT WITH ANTIBIOTIC BEAD EXCHANGE;  Surgeon: Pilar Plate, DPM;  Location: [facility] OR;  Service: Orthopedics/Podiatry;  Laterality: Left;   TENDON REPAIR Left 02/21/2023   Procedure: TENDON TRANSFER;  Surgeon: Pilar Plate, DPM;  Location: [facility] OR;  Service: Orthopedics/Podiatry;  Laterality: Left;   Past Medical History:  Diagnosis Date   Diabetes mellitus without complication (HCC)    GERD (gastroesophageal reflux disease)    Ht 5\' 6"  (1.676 m)   Wt 181 lb (82.1 kg)   BMI 29.21 kg/m   Opioid Risk Score:   Fall Risk Score:  `1  Depression screen [facility]  2/9     02/10/2023    9:09 AM  Depression screen PHQ 2/9  Decreased Interest 0  Down, Depressed, Hopeless 0  PHQ - 2 Score 0  Altered sleeping 0  Tired, decreased energy 0  Change in appetite 0  Feeling bad or failure about yourself  0  Trouble concentrating 0  Moving slowly or fidgety/restless 0  Suicidal thoughts 0  PHQ-9 Score 0      Review of Systems  Musculoskeletal:  Positive for gait problem.  All other systems reviewed and are negative.     Objective:   Physical Exam   PE: Constitution: Appropriate appearance for age. No apparent distress  Resp: No respiratory distress. No accessory muscle usage. on RA and CTAB Cardio: Well perfused appearance. No peripheral edema. Abdomen: Nondistended. Nontender.   Psych: Appropriate mood and affect. Skin:  Left foot: Dried blood soaked gauze wrap removed, along with Ace bandage.  Incision appears well-approximated, without active drainage, staples intact.  Photographed as above, sent to Dr. Annamary Rummage.  MSK: Good range of motion of distal toes.  Minimal left ankle range of motion due to restrictions, does have active movement in both dorsiflexion and plantarflexion  Neurologic Exam:   Sensory exam: revealed normal sensation in all dermatomal regions in bilateral upper extremities, bilateral lower extremities, and with reduced sensation to light touch in just over left foot lateral site: Unchanged  motor exam: strength 5/5 throughout BL UE and Les Coordination: Fine motor coordination was normal.   Gait: +antalgic gait with L CAM boot; overall stable but reduced stride length and R lean-faster gait pattern than last exam.  Using crutches.          Assessment & Plan:   Bryshere Mcferren is a 62 y.o. year old male  who  has a past medical history of Diabetes mellitus without complication (HCC) and GERD (gastroesophageal reflux disease).   They are presenting to PM&R clinic for follow up related to  L lateral foot pain s/p ORIF and  subsequent infection of 5th metatarsal .   Chronic pain syndrome Encounter for therapeutic drug monitoring Encounter for opiate analgesic use agreement Norco discontinued by his hospital, states he has no chronic pain needs at this time. Continue Flexeril 3 times daily, gabapentin 300 mg 3 times daily, Mobic 15 mg daily; would limit ibuprofen 600 mg as needed rarely or never given Mobic,  which is as frequently as patient uses it   Patient to call clinic if he has any chronic pain needs over [facility]annie Knee medication.  Follow-up in 2 months.  Abnormality of gait and mobility Chronic foot pain, left Subacute osteomyelitis, unspecified site [facility]) Being followed with infectious disease and podiatry as above for treatment of osteomyelitis  Discussed patient with Dr. Annamary Rummage while in office, sent photos of incision site as above, he is to call the patient's for staple removal by the end of this week and for follow-up.  Redressed left foot with gauze and Ace wrap in clinic.

## 2023-03-11 ENCOUNTER — Telehealth: Payer: Self-pay

## 2023-03-11 ENCOUNTER — Other Ambulatory Visit: Payer: Self-pay

## 2023-03-11 ENCOUNTER — Encounter: Payer: Self-pay | Admitting: Infectious Diseases

## 2023-03-11 ENCOUNTER — Ambulatory Visit: Payer: 59 | Admitting: Infectious Diseases

## 2023-03-11 VITALS — BP 121/84 | HR 83 | Temp 97.8°F | Ht 66.0 in | Wt 181.0 lb

## 2023-03-11 DIAGNOSIS — M86172 Other acute osteomyelitis, left ankle and foot: Secondary | ICD-10-CM | POA: Diagnosis not present

## 2023-03-11 DIAGNOSIS — Z7984 Long term (current) use of oral hypoglycemic drugs: Secondary | ICD-10-CM | POA: Diagnosis not present

## 2023-03-11 DIAGNOSIS — T847XXD Infection and inflammatory reaction due to other internal orthopedic prosthetic devices, implants and grafts, subsequent encounter: Secondary | ICD-10-CM

## 2023-03-11 DIAGNOSIS — Z79899 Other long term (current) drug therapy: Secondary | ICD-10-CM | POA: Insufficient documentation

## 2023-03-11 DIAGNOSIS — Z452 Encounter for adjustment and management of vascular access device: Secondary | ICD-10-CM | POA: Insufficient documentation

## 2023-03-11 DIAGNOSIS — E119 Type 2 diabetes mellitus without complications: Secondary | ICD-10-CM | POA: Diagnosis not present

## 2023-03-11 DIAGNOSIS — Z029 Encounter for administrative examinations, unspecified: Secondary | ICD-10-CM | POA: Insufficient documentation

## 2023-03-11 NOTE — Progress Notes (Addendum)
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) 02/14/2023   Diabetes mellitus, new onset (HCC) 02/13/2023   Osteomyelitis (HCC) 02/13/2023   Chronic pain syndrome 02/10/2023   Chronic foot pain, left 02/10/2023   Ankle weakness 02/10/2023   Chronic bilateral low back pain without sciatica 02/10/2023   Hardware complicating wound infection (HCC) 02/04/2023   Encounter for therapeutic drug monitoring 02/04/2023   Encounter for orthopedic follow-up care 10/21/2018   Impingement syndrome of shoulder region 06/09/2018   Pain in joint of right shoulder 03/16/2018    Patient's Medications  New Prescriptions   No medications on file  Previous Medications   ACETAMINOPHEN (TYLENOL) 500 MG TABLET    Take 2 tablets (1,000 mg total) by mouth every 6 (six) hours.   ATORVASTATIN (LIPITOR) 40 MG TABLET    Take 1 tablet (40 mg total) by mouth daily.   CYCLOBENZAPRINE (FLEXERIL) 5 MG TABLET    Take 1 tablet (5 mg total) by mouth 3 (three) times daily as needed for muscle spasms.   DAPTOMYCIN (CUBICIN) IVPB    Inject 700 mg into the vein daily. Indication:  Osteomyelitis First Dose: Yes Last Day of Therapy:  03/29/23 Labs - Once weekly:  CBC/D, BMP, and CPK Labs - Once weekly: ESR and CRP Method of administration: IV Push Method of administration may be changed at the discretion of home infusion pharmacist based upon assessment of the patient and/or caregiver's ability to self-administer the medication ordered.   ESOMEPRAZOLE (NEXIUM) 40 MG CAPSULE    Take 40 mg by mouth daily.   GABAPENTIN (NEURONTIN) 400 MG CAPSULE    Take 1 capsule  (400 mg total) by mouth 3 (three) times daily.   HYDROCHLOROTHIAZIDE (MICROZIDE) 12.5 MG CAPSULE    Take 12.5 mg by mouth daily.   IBUPROFEN (ADVIL) 600 MG TABLET    Take 1 tablet (600 mg total) by mouth every 6 (six) hours as needed for mild pain (pain score 1-3) or moderate pain (pain score 4-6).   LISINOPRIL (ZESTRIL) 2.5 MG TABLET       LORATADINE (CLARITIN) 10 MG TABLET    Take 10 mg by mouth daily.   MELOXICAM (MOBIC) 15 MG TABLET    Take 15 mg by mouth every morning.   METFORMIN (GLUCOPHAGE-XR) 500 MG 24 HR TABLET    Take 1 tablet (500 mg total) by mouth 2 (two) times daily with a meal.   TAMSULOSIN (FLOMAX) 0.4 MG CAPS CAPSULE    Take 1 capsule (0.4 mg total) by mouth daily.   VITAMIN D, ERGOCALCIFEROL, (DRISDOL) 1.25 MG (50000 UNIT) CAPS CAPSULE    Take 1 capsule (50,000 Units total) by mouth every 7 (seven) days for 6 doses.  Modified Medications   No medications on file  Discontinued Medications   No medications on file    Subjective: 62 year old male with type II DM with history of having a fall with fifth metatarsal fracture requiring hardware placement status post on and off oral antibiotics followed in clinic by myself as well as Dr. Annamary Rummage who is here for HFU ( 11/7-11/18) after elective admission for hardware removal of left foot  of the nonunion area of the fifth metatarsal. See my note on 10/29 for details.  He underwent multiple surgical interventions by Podiatry 11/9, 11/12 and 11/15 with cultures growing MRSE, MS staph caprae and discharged on 11/18 to complete 6 weeks course of daptomycin.  12/3 He is getting IV abtx from rt arm PICC with no concerns related to PICC or abtx. Seen in the pain clinic yesterday and had dressing changed ( note is still pending). Pic of left foot reviewed. Reports wound looks good with no drainage, swelling or tenderness. He has an appt with Dr Annamary Rummage on Thursday. Reports he is taking metformin for DM and BG is well controlled.   Review  of Systems: all systems reviewed including MSK and negative.   Past Medical History:  Diagnosis Date   Diabetes mellitus without complication (HCC)    GERD (gastroesophageal reflux disease)    Past Surgical History:  Procedure Laterality Date   AMPUTATION Left 02/18/2023   Procedure: Repeat irrigation and debridement, resection of 5th metatarsal, antibiotic beads;  Surgeon: Pilar Plate, DPM;  Location: [facility] OR;  Service: Orthopedics/Podiatry;  Laterality: Left;  Left foot resection of 5th metatarsal base, repeat washout, antibiotic beads stimulan   FOOT SURGERY Left    HARDWARE REMOVAL Left 02/15/2023   Procedure: HARDWARE REMOVAL with BONE BIOPSY.;  Surgeon: Pilar Plate, DPM;  Location: [facility] OR;  Service: Orthopedics/Podiatry;  Laterality: Left;   IRRIGATION AND DEBRIDEMENT FOOT Left 02/21/2023   Procedure: REPEAT IRRIGATION AND DEBRIDEMENT FOOT WITH ANTIBIOTIC BEAD EXCHANGE;  Surgeon: Pilar Plate, DPM;  Location: [facility];  Service: Orthopedics/Podiatry;  Laterality: Left;   TENDON REPAIR Left 02/21/2023   Procedure: TENDON TRANSFER;  Surgeon: Pilar Plate, DPM;  Location: [facility];  Service: Orthopedics/Podiatry;  Laterality: Left;   Social History   Tobacco Use   Smoking status: Never   Smokeless tobacco: Never  Vaping Use   Vaping status: Never Used  Substance Use Topics   Alcohol use: Never   Drug use: Never    No family history on file.  Allergies  Allergen Reactions   Hydrocodone Nausea And Vomiting    Health Maintenance  Topic Date Due   COVID-19 Vaccine (1) Never done   FOOT EXAM  Never done   OPHTHALMOLOGY EXAM  Never done   Diabetic kidney evaluation - Urine ACR  Never done   Hepatitis C Screening  Never done   DTaP/Tdap/Td (1 - Tdap) Never done   Zoster Vaccines- Shingrix (1 of 2) Never done   Colonoscopy  Never done   INFLUENZA VACCINE  Never done   HEMOGLOBIN A1C  08/14/2023   Diabetic kidney evaluation - eGFR  measurement  02/22/2024   HIV Screening  Completed   HPV VACCINES  Aged Out    Objective:  Vitals:   03/11/23 1448  BP: 121/84  Pulse: 83  Temp: 97.8 F (36.6 C)  TempSrc: Temporal  SpO2: 99%  Weight: 181 lb (82.1 kg)  Height: 5\' 6"  (1.676 m)   Body mass index is 29.21 kg/m.  Physical Exam Constitutional:      Appearance: Normal appearance.  HENT:     Head: Normocephalic and atraumatic.      Mouth: Mucous membranes are moist.  Eyes:    Conjunctiva/sclera: Conjunctivae normal.     Pupils:  Cardiovascular:     Rate and Rhythm: Normal rate and regular rhythm.     Heart sounds:   Pulmonary:     Effort: Pulmonary effort is  normal.     Breath sounds:   Abdominal:     General: Non distended     Palpations:   Musculoskeletal:        General: Normal range of motion. Ambulatory, left foot bandaged and has an immobilizer. Patient deferred removal of bandage due to wound dressing yesterday. Pic in media from yesterday reviewed with no signs of infection, no drainage, well coapted incision, sutures +.   Skin:    General: Skin is warm and dry.     Comments: rt arm picc with no concerns  Neurological:     General: grossly non focal     Mental Status: awake, alert and oriented to person, place, and time.   Psychiatric:        Mood and Affect: Mood normal.   Lab Results Lab Results  Component Value Date   WBC 9.5 02/22/2023   HGB 13.3 02/22/2023   HCT 36.2 (L) 02/22/2023   MCV 87.4 02/22/2023   PLT 220 02/22/2023    Lab Results  Component Value Date   CREATININE 0.73 02/22/2023   BUN 17 02/22/2023   NA 133 (L) 02/22/2023   K 4.0 02/22/2023   CL 104 02/22/2023   CO2 22 02/22/2023    Lab Results  Component Value Date   ALT 38 02/13/2023   AST 24 02/13/2023   ALKPHOS 116 02/13/2023   BILITOT 1.3 (H) 02/13/2023    Lab Results  Component Value Date   CHOL 152 02/14/2023   HDL 27 (L) 02/14/2023   LDLCALC 50 02/14/2023   TRIG 375 (H) 02/14/2023    CHOLHDL 5.6 02/14/2023   No results found for: "LABRPR", "RPRTITER" No results found for: "HIV1RNAQUANT", "HIV1RNAVL", "CD4TABS"   Assessment/Plan 62 year old male with type II DM with history of having a fall with fifth metatarsal fracture requiring hardware placement status post on and off oral antibiotics followed in clinic by myself as well as Dr. Annamary Rummage who is electively admitted for hardware removal of left foot of the nonunion area of the fifth metatarsal. See my note on 10/29 for details.    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 CX with MRSE and staph caprae, gram strain with GNR and GPC. Path fifth proximal metatarsal margin with chronic osteomyelitis 11/12 s/p I&D with resection of proximal half of fifth metatarsal base, application of antibiotic beads. path with distal resection margin with chronic osteomyelitis and focal evidence of acute osteomyelitis. The previously inked margin is negative for acute osteomyelitis  Plan  - Complete 6 weeks of IV abtx from 11/15,  EOT 12/26 - Fu in 3 weeks prior to EOT - Fu with Dr Annamary Rummage as instructed   # Medication Management  - 11/21 CRP 5 - 11/29 CBC and CMP unremarkable, ck 25, CRP 75  # PICC  - no concerns  # DM 2 - discussed BG control to help with healing of wound  I have personally spent 40 minutes involved in face-to-face and non-face-to-face activities for this patient on the day of the visit. Professional time spent includes the following activities: Preparing to see the patient (review of tests), Obtaining and/or reviewing separately obtained history (admission/discharge record), Performing a medically appropriate examination and/or evaluation , Ordering medications/tests/procedures, referring and communicating with other health care professionals, Documenting clinical  information in the EMR, Independently interpreting results (not separately reported), Communicating  results to the patient/family/caregiver, Counseling and educating the patient/family/caregiver and Care coordination (not separately reported).   Of note, portions of this note may have been created with voice recognition software. While this note has been edited for accuracy, occasional wrong-word or 'sound-a-like' substitutions may have occurred due to the inherent limitations of voice recognition software.   Victoriano Lain, MD [facility] 03/11/2023, 3:08 PM

## 2023-03-11 NOTE — Telephone Encounter (Signed)
Per Dr. Elinor Parkinson extend patient IV abx till 12/26. Sent message to [employer] about orders.

## 2023-03-13 ENCOUNTER — Ambulatory Visit (INDEPENDENT_AMBULATORY_CARE_PROVIDER_SITE_OTHER): Payer: 59

## 2023-03-13 ENCOUNTER — Ambulatory Visit: Payer: 59 | Admitting: Podiatry

## 2023-03-13 DIAGNOSIS — Z9889 Other specified postprocedural states: Secondary | ICD-10-CM

## 2023-03-13 NOTE — Progress Notes (Signed)
  Subjective:  Patient ID: Tyler Rangel, male    DOB: [birth year],  MRN: [id number]  Chief Complaint  Patient presents with   hospital f/u    DOS: 02/21/2023 Procedure: Repeat irrigation and excisional debridement with partial resection of fifth metatarsal, left foot; peroneus longus to cuboid tendon transfer left foot; antibiotic bead exchange with insertion of vancomycin and tobramycin impregnated dissolvable antibiotic drug delivery device left foot  62 y.o. male returns for post-op check.  Patient returns for first postop check status post above procedures.  Previously underwent multiple debridements bone biopsy and hardware removal of the fifth metatarsal proximal half.  He is coming in today for staple removal.  Was recently seen in infectious disease office on 04/07/2023.  He is completing a 6 weeks course of IV antibiotics on 11/15 end of treatment 12/26.  Has been nonweightbearing to the left lower extremity in cam boot.  Has kept dressing clean dry and intact until recently. He reports pain is much decreased from prior, improving.   Review of Systems: Negative except as noted in the HPI. Denies N/V/F/Ch.   Objective:  There were no vitals filed for this visit. There is no height or weight on file to calculate BMI. Constitutional Well developed. Well nourished.  Vascular Foot warm and well perfused. Capillary refill normal to all digits.  Calf is soft and supple, no posterior calf or knee pain, negative Homans' sign  Neurologic Normal speech. Oriented to person, place, and time. Epicritic sensation to light touch grossly present bilaterally.  Dermatologic Skin healing well without signs of infection. Skin edges well coapted without signs of infection.  Orthopedic: Tenderness to palpation noted about the surgical site.   Multiple view plain film radiographs: 03/13/2023 XR 3 views left foot AP lateral oblique nonweightbearing.  Findings: Tension directed to the fifth metatarsal there  is been resection of the proximal two thirds of the bone.  Questionable osteolysis of the fourth metatarsal base Assessment:   1. Post-operative state    Plan:  Patient was evaluated and treated and all questions answered.  S/p foot surgery left foot repeat debridement resection of fifth metatarsal proximal two thirds and peroneus longus to cuboid tendon transfer with wound closure -Progressing as expected post-operatively. -XR: As above questionable osteolysis of the fourth metatarsal base however not definitive for OM. No fracture or other acute osseous issue noted.  -WB Status: Nonweightbearing in cam boot -Sutures: Staples removed at this visit. -Medications: Continue antibiotics per infectious disease recommendations -No dressings are currently indicated.  Okay to wash with warm soapy water  Return in about 4 weeks (around 04/10/2023) for POV 2 L foot.         Corinna Gab, DPM [facility] / [facility]

## 2023-03-28 ENCOUNTER — Ambulatory Visit: Payer: 59 | Admitting: Infectious Diseases

## 2023-03-28 ENCOUNTER — Other Ambulatory Visit: Payer: Self-pay

## 2023-03-28 ENCOUNTER — Telehealth: Payer: Self-pay

## 2023-03-28 ENCOUNTER — Encounter: Payer: Self-pay | Admitting: Infectious Diseases

## 2023-03-28 VITALS — BP 132/80 | HR 77 | Resp 16 | Ht 66.0 in | Wt 181.0 lb

## 2023-03-28 DIAGNOSIS — M86172 Other acute osteomyelitis, left ankle and foot: Secondary | ICD-10-CM | POA: Diagnosis not present

## 2023-03-28 DIAGNOSIS — T847XXD Infection and inflammatory reaction due to other internal orthopedic prosthetic devices, implants and grafts, subsequent encounter: Secondary | ICD-10-CM | POA: Diagnosis not present

## 2023-03-28 DIAGNOSIS — Z452 Encounter for adjustment and management of vascular access device: Secondary | ICD-10-CM

## 2023-03-28 DIAGNOSIS — Z79899 Other long term (current) drug therapy: Secondary | ICD-10-CM

## 2023-03-28 NOTE — Telephone Encounter (Signed)
  Per provider ok to PULL PICC after End Date.   Provider:Sabina Manandhar MD End Date: 04/03/23.   Notified [facility] and [facility] to contact Home Health Nurse.

## 2023-03-28 NOTE — Progress Notes (Addendum)
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) 02/14/2023   Diabetes mellitus, new onset (HCC) 02/13/2023   Osteomyelitis (HCC) 02/13/2023   Chronic pain syndrome 02/10/2023   Chronic foot pain, left 02/10/2023   Ankle weakness 02/10/2023   Chronic bilateral low back pain without sciatica 02/10/2023   Hardware complicating wound infection (HCC) 02/04/2023   Encounter for therapeutic drug monitoring 02/04/2023   Encounter for orthopedic follow-up care 10/21/2018   Impingement syndrome of shoulder region 06/09/2018   Pain in joint of right shoulder 03/16/2018    Patient's Medications  New Prescriptions   No medications on file  Previous Medications   ACETAMINOPHEN (TYLENOL) 500 MG TABLET    Take 2 tablets (1,000 mg total) by mouth every 6 (six) hours.   ATORVASTATIN (LIPITOR) 40 MG TABLET    Take 1 tablet (40 mg total) by mouth daily.   CYCLOBENZAPRINE (FLEXERIL) 5 MG TABLET    Take 1 tablet (5 mg total) by mouth 3 (three) times daily as needed for muscle spasms.   DAPTOMYCIN (CUBICIN) IVPB    Inject 700 mg into the vein daily. Indication:  Osteomyelitis First Dose: Yes Last Day of Therapy:  03/29/23 Labs - Once weekly:  CBC/D, BMP, and CPK Labs - Once weekly: ESR and CRP Method of administration: IV Push Method of administration may be changed at the discretion of home infusion pharmacist based upon assessment of the patient and/or caregiver's ability to self-administer the medication ordered.   ESOMEPRAZOLE (NEXIUM) 40 MG CAPSULE    Take 40 mg by mouth daily.   GABAPENTIN (NEURONTIN) 400 MG CAPSULE    Take 1 capsule  (400 mg total) by mouth 3 (three) times daily.   HYDROCHLOROTHIAZIDE (MICROZIDE) 12.5 MG CAPSULE    Take 12.5 mg by mouth daily.   IBUPROFEN (ADVIL) 600 MG TABLET    Take 1 tablet (600 mg total) by mouth every 6 (six) hours as needed for mild pain (pain score 1-3) or moderate pain (pain score 4-6).   LISINOPRIL (ZESTRIL) 2.5 MG TABLET       LORATADINE (CLARITIN) 10 MG TABLET    Take 10 mg by mouth daily.   MELOXICAM (MOBIC) 15 MG TABLET    Take 15 mg by mouth every morning.   METFORMIN (GLUCOPHAGE-XR) 500 MG 24 HR TABLET    Take 1 tablet (500 mg total) by mouth 2 (two) times daily with a meal.   TAMSULOSIN (FLOMAX) 0.4 MG CAPS CAPSULE    Take 1 capsule (0.4 mg total) by mouth daily.   VITAMIN D, ERGOCALCIFEROL, (DRISDOL) 1.25 MG (50000 UNIT) CAPS CAPSULE    Take 1 capsule (50,000 Units total) by mouth every 7 (seven) days for 6 doses.  Modified Medications   No medications on file  Discontinued Medications   No medications on file    Subjective: 62 year old male with type II DM with history of having a fall with fifth metatarsal fracture requiring hardware placement status post on and off oral antibiotics followed in clinic by myself as well as Dr. Annamary Rummage who is here for HFU ( 11/7-11/18) after elective admission for hardware removal of left foot  of the nonunion area of the fifth metatarsal. See my note on 10/29 for details.  He underwent multiple surgical interventions by Podiatry 11/9, 11/12 and 11/15 with cultures growing MRSE, MS staph caprae and discharged on 11/18 to complete 6 weeks course of daptomycin.  Continues to be on IV daptomycin through PICC without concerns. Following Podiatry.   12/20 Getting IV daptomycin without any concerns related to PICC or abtx. Left foot has healed, some numbness around the surgical site  and feels "touchy". He feels dull ache when standing, uses crutches to walk and puts 50% of weight on both legs. Last seen by Podiatry. Wound healing as expected  post op and no signs of infection. Xray 03/13/2023: osteolysis of the fourth metatarsal base however not definitive for OM. No fracture or other acute osseous issue noted. He is happy with the progress and no complaints today.   Review of Systems: all systems reviewed including MSK and negative.   Past Medical History:  Diagnosis Date   Diabetes mellitus without complication (HCC)    GERD (gastroesophageal reflux disease)    Past Surgical History:  Procedure Laterality Date   AMPUTATION Left 02/18/2023   Procedure: Repeat irrigation and debridement, resection of 5th metatarsal, antibiotic beads;  Surgeon: Pilar Plate, DPM;  Location: [facility] OR;  Service: Orthopedics/Podiatry;  Laterality: Left;  Left foot resection of 5th metatarsal base, repeat washout, antibiotic beads stimulan   FOOT SURGERY Left    HARDWARE REMOVAL Left 02/15/2023   Procedure: HARDWARE REMOVAL with BONE BIOPSY.;  Surgeon: Pilar Plate, DPM;  Location: [facility] OR;  Service: Orthopedics/Podiatry;  Laterality: Left;   IRRIGATION AND DEBRIDEMENT FOOT Left 02/21/2023   Procedure: REPEAT IRRIGATION AND DEBRIDEMENT FOOT WITH ANTIBIOTIC BEAD EXCHANGE;  Surgeon: Pilar Plate, DPM;  Location: [facility];  Service: Orthopedics/Podiatry;  Laterality: Left;   TENDON REPAIR Left 02/21/2023   Procedure: TENDON TRANSFER;  Surgeon: Pilar Plate, DPM;  Location: [facility];  Service: Orthopedics/Podiatry;  Laterality: Left;   Social History   Tobacco Use   Smoking status: Never   Smokeless tobacco: Never  Vaping Use   Vaping status: Never Used  Substance Use Topics   Alcohol use: Never   Drug use: Never    No family history on file.  Allergies  Allergen Reactions   Hydrocodone Nausea And Vomiting    Health Maintenance  Topic Date Due   COVID-19 Vaccine (1) Never done   FOOT EXAM  Never done   OPHTHALMOLOGY EXAM  Never done   Diabetic kidney evaluation - Urine ACR  Never done   Hepatitis C  Screening  Never done   DTaP/Tdap/Td (1 - Tdap) Never done   Zoster Vaccines- Shingrix (1 of 2) Never done   Colonoscopy  Never done   INFLUENZA VACCINE  Never done   HEMOGLOBIN A1C  08/14/2023   Diabetic kidney evaluation - eGFR measurement  02/22/2024   HIV Screening  Completed   HPV VACCINES  Aged Out    Objective: BP 132/80   Pulse 77   Resp 16   Ht 5\' 6"  (1.676 m)   Wt 181 lb (82.1 kg)   BMI 29.21 kg/m    Physical Exam Constitutional:      Appearance: Normal appearance.  HENT:     Head: Normocephalic and atraumatic.      Mouth: Mucous membranes are moist.  Eyes:    Conjunctiva/sclera: Conjunctivae normal.     Pupils:  Cardiovascular:     Rate and Rhythm:  Normal rate and regular rhythm.     Heart sounds:   Pulmonary:     Effort: Pulmonary effort is normal.     Breath sounds:   Abdominal:     General: Non distended     Palpations:   Musculoskeletal:        General: Normal range of motion. Ambulatory with crutches  Left lateral foot wound has healed with no signs of infection     Skin:    General: Skin is warm and dry.     Comments: rt arm picc with no concerns  Neurological:     General: grossly non focal     Mental Status: awake, alert and oriented to person, place, and time.   Psychiatric:        Mood and Affect: Mood normal.   Lab Results Lab Results  Component Value Date   WBC 9.5 02/22/2023   HGB 13.3 02/22/2023   HCT 36.2 (L) 02/22/2023   MCV 87.4 02/22/2023   PLT 220 02/22/2023    Lab Results  Component Value Date   CREATININE 0.73 02/22/2023   BUN 17 02/22/2023   NA 133 (L) 02/22/2023   K 4.0 02/22/2023   CL 104 02/22/2023   CO2 22 02/22/2023    Lab Results  Component Value Date   ALT 38 02/13/2023   AST 24 02/13/2023   ALKPHOS 116 02/13/2023   BILITOT 1.3 (H) 02/13/2023    Lab Results  Component Value Date   CHOL 152 02/14/2023   HDL 27 (L) 02/14/2023   LDLCALC 50 02/14/2023   TRIG 375 (H) 02/14/2023   CHOLHDL 5.6  02/14/2023   No results found for: "LABRPR", "RPRTITER" No results found for: "HIV1RNAQUANT", "HIV1RNAVL", "CD4TABS"   Assessment/Plan 62 year old male with type II DM with history of having a fall with fifth metatarsal fracture requiring hardware placement status post on and off oral antibiotics followed in clinic by myself as well as Dr. Annamary Rummage who is electively admitted for hardware removal of left foot of the nonunion area of the fifth metatarsal. See my note on 10/29 for details.    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 CX with MRSE and staph caprae, gram strain with GNR and GPC. Path fifth proximal metatarsal margin with chronic osteomyelitis 11/12 s/p I&D with resection of proximal half of fifth metatarsal base, application of antibiotic beads. path with distal resection margin with chronic osteomyelitis and focal evidence of acute osteomyelitis. The previously inked margin is negative for acute osteomyelitis - Per Dr Annamary Rummage "all metallic removed, there is a small anchor in the cuboid to anchor the tendon transfer ".   Plan  - Complete 6 weeks of IV abtx from 11/15,  EOT 12/26 - Discussed signs and symptoms that indicates recurrence of infection to watch - Fu in 3 weeks  - Fu with Dr Annamary Rummage as instructed   # Medication Management  - 12/6 CBC and CMP unremarkable, Eosinophils 4.6, CK 35, CRP 13  - 11/29  CRP 75 - CRP downtrending  # PICC  - no concerns, to be removed after last dose on 12/26  # DM 2 - Fu with PCP for BG control  I have personally spent 30 minutes involved in face-to-face and non-face-to-face activities for this patient on the day of the visit. Professional time spent includes the following activities: Preparing  to see the patient (review of tests), Obtaining and/or reviewing separately obtained  history (admission/discharge record), Performing a medically appropriate examination and/or evaluation , Ordering medications/tests/procedures, referring and communicating with other health care professionals, Documenting clinical information in the EMR, Independently interpreting results (not separately reported), Communicating results to the patient/family/caregiver, Counseling and educating the patient/family/caregiver and Care coordination (not separately reported).   Of note, portions of this note may have been created with voice recognition software. While this note has been edited for accuracy, occasional wrong-word or 'sound-a-like' substitutions may have occurred due to the inherent limitations of voice recognition software.   Victoriano Lain, MD [facility] for Infectious Disease [facility] 03/28/2023, 11:29 AM

## 2023-03-29 IMAGING — MR MR FOOT*R* W/O CM
6 series · 40 of 40 positions shown · non-contrast
Comparison: Plain films right foot 07/25/2020.

CLINICAL DATA: Pain along the medial aspect of the right foot. No
known injury.

EXAM:
MRI OF THE RIGHT FOREFOOT WITHOUT CONTRAST
TECHNIQUE: Multiplanar, multisequence MR imaging of the right forefoot was
performed. No intravenous contrast was administered.

[Series 4: T1 · coronal · right · 3.0mm · 0.31mm/px · 9 of 42 slices shown (1 of 2)]
[im 1/42]
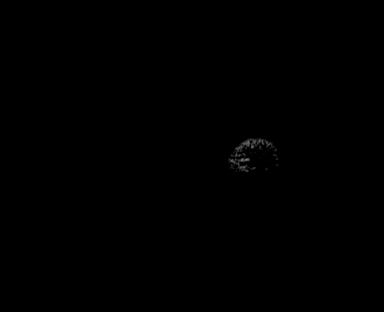
[im 6/42]
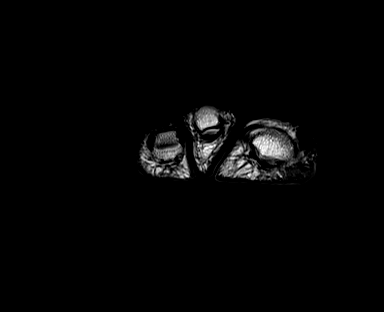
[im 11/42]
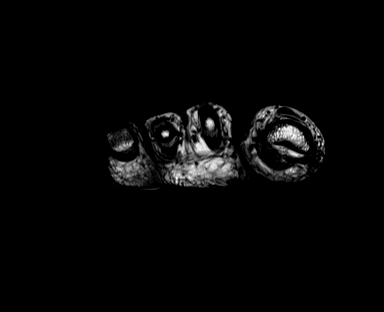
[im 16/42]
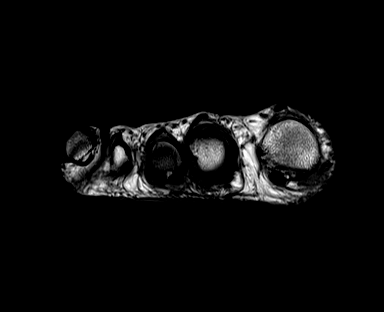
[im 21/42]
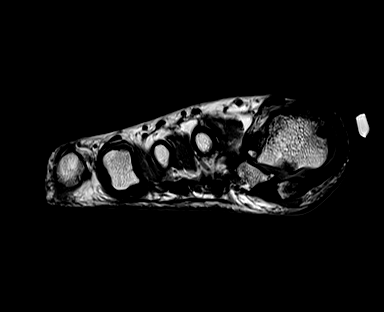
[im 26/42]
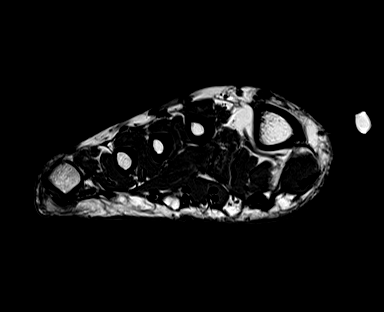
[im 31/42]
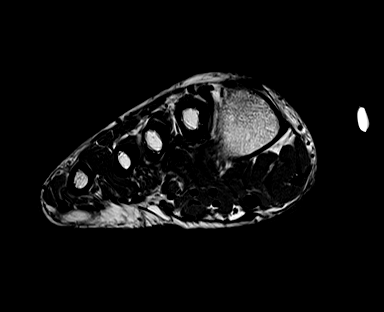
[im 36/42]
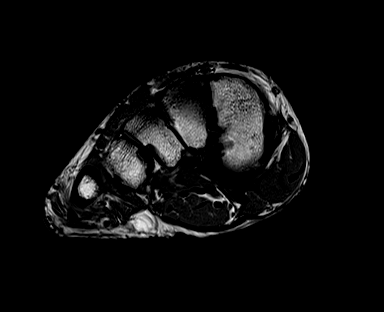
[im 42/42]
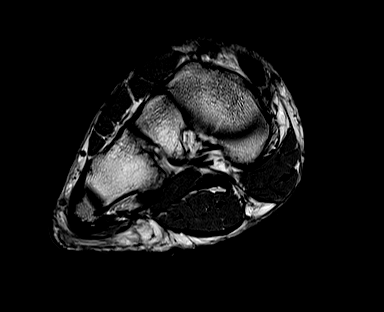

[Series 5: T2 fat-sat · coronal · right · 3.0mm · 0.31mm/px · 9 of 42 slices shown (1 of 2)]
[im 1/42]
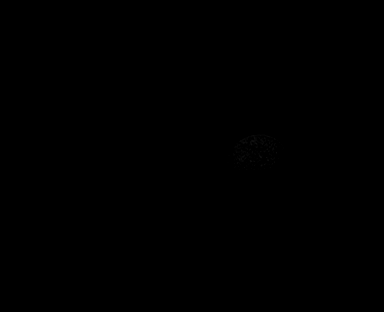
[im 6/42]
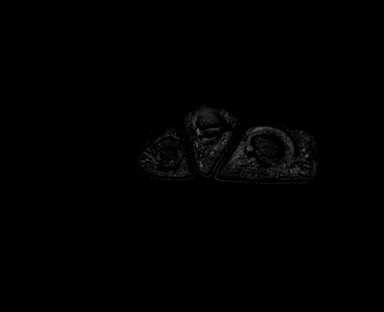
[im 11/42]
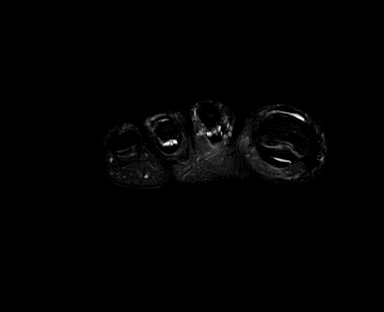
[im 16/42]
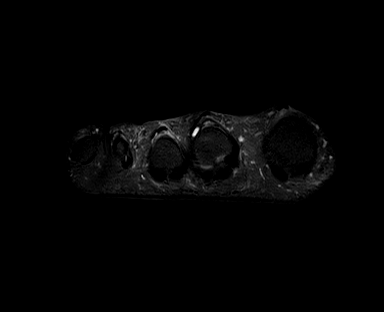
[im 21/42]
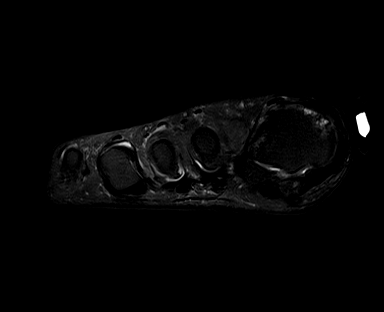
[im 26/42]
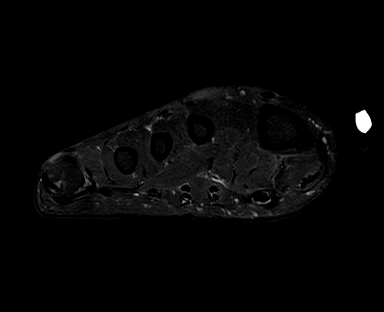
[im 31/42]
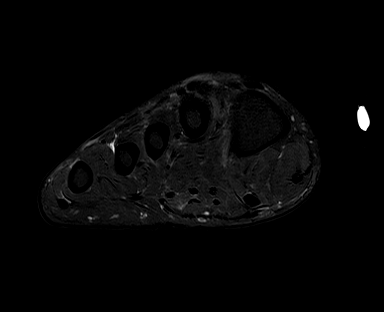
[im 36/42]
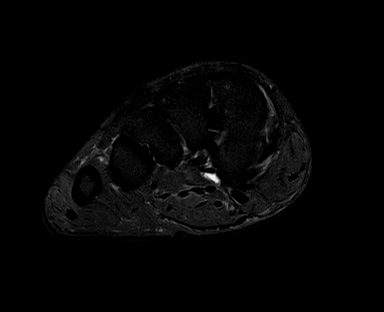
[im 42/42]
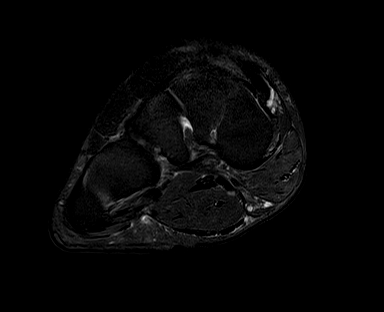

[Series 6: STIR · sagittal · right · 3.0mm · 0.70mm/px · 7 of 33 slices shown (1 of 2)]
[im 1/33]
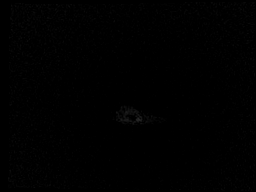
[im 6/33]
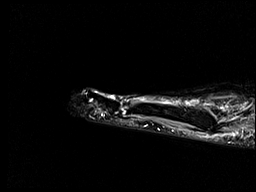
[im 11/33]
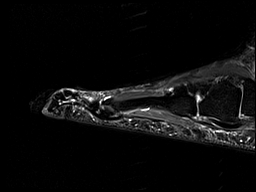
[im 17/33]
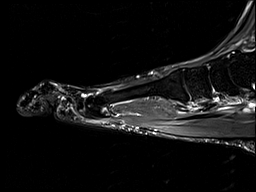
[im 22/33]
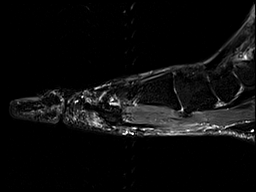
[im 27/33]
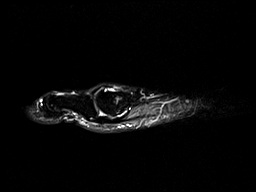
[im 33/33]
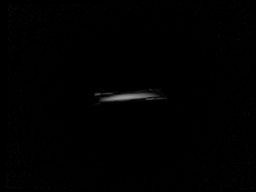

[Series 7: T1 · axial · right · 3.0mm · 0.47mm/px · z∈[-71,+6]mm · 4 of 21 slices shown (2 of 2)]
[im 1/21]
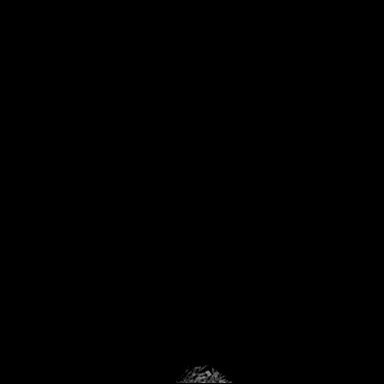
[im 7/21]
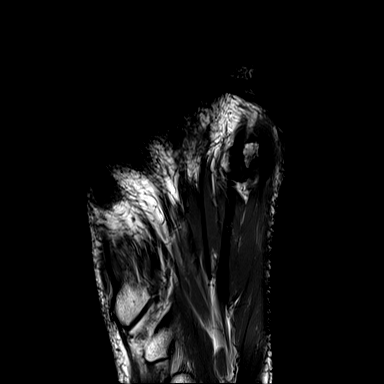
[im 14/21]
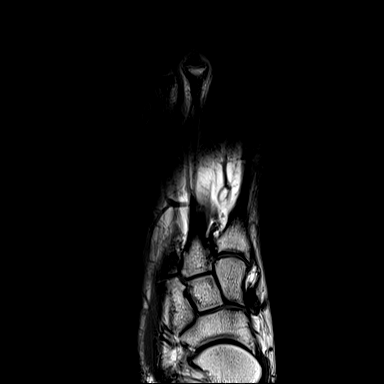
[im 21/21]
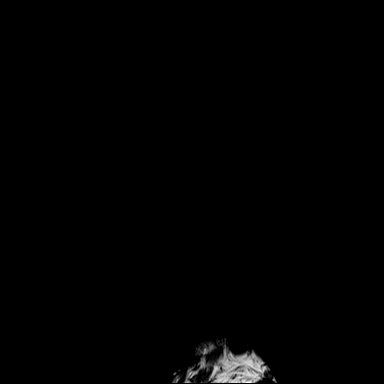

[Series 8: T2 fat-sat · axial · right · 3.0mm · 0.47mm/px · z∈[-71,+6]mm · 4 of 21 slices shown (2 of 2)]
[im 1/21]
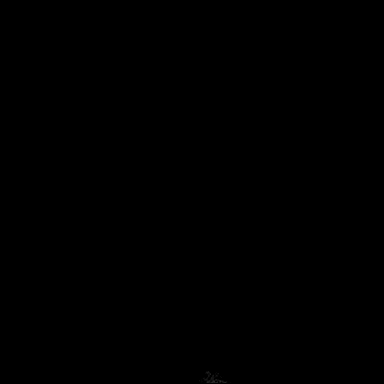
[im 7/21]
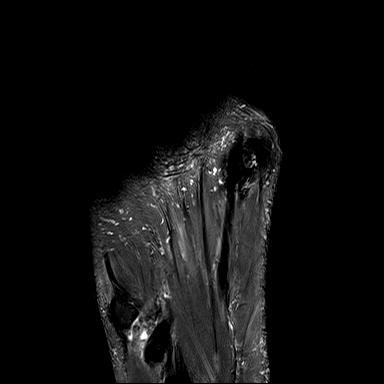
[im 14/21]
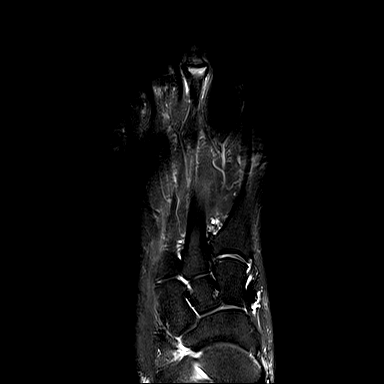
[im 21/21]
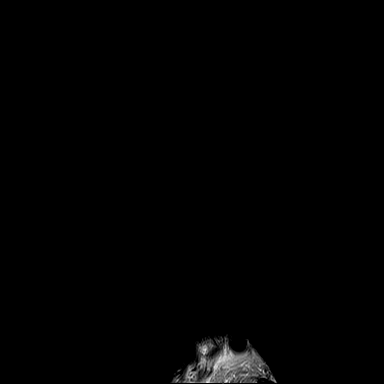

[Series 9: STIR · sagittal · right · 3.0mm · 0.70mm/px · 7 of 33 slices shown (2 of 2)]
[im 1/33]
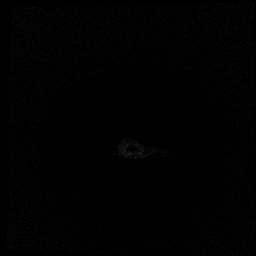
[im 6/33]
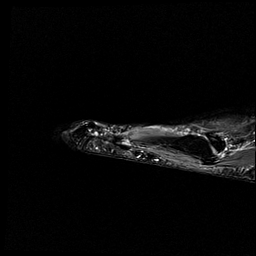
[im 11/33]
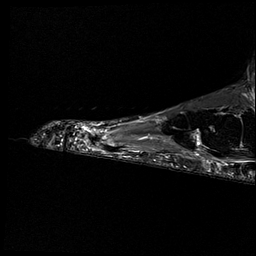
[im 17/33]
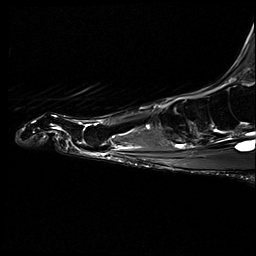
[im 22/33]
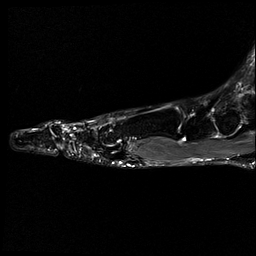
[im 27/33]
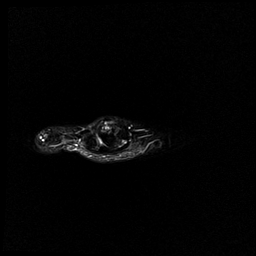
[im 33/33]
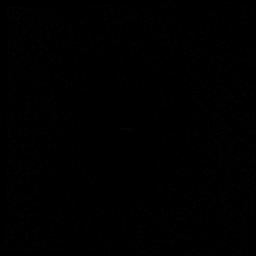

[40 of 40 positions shown; findings below may reference images not displayed]

FINDINGS: Bones/Joint/Cartilage

No fracture, stress change or worrisome lesion is identified. The
patient has a hallux valgus deformity. Moderate to moderately severe
first MTP osteoarthritis is present with cartilage loss,
osteophytosis and a few small subchondral cysts in the first
metatarsal head and tibial sesamoid. No erosion is identified.
Punctate foci of T2 hyperintensity in the head of the fifth
metatarsal an appearance most consistent with benign cysts. No joint
effusion or evidence of synovitis.

Ligaments

The intersesamoid ligament appears degenerated and may be torn. The
ligament is partially calcified. Ligaments are otherwise negative.

Muscles and Tendons

Intact and normal in appearance.

Soft tissues

Negative.  No fluid collection or mass.
IMPRESSION: Hallux valgus and moderate to moderately severe first MTP
osteoarthritis. As noted above, the inter-sesamoid ligament appears
markedly degenerated and is likely torn.

Small, benign appearing cysts in the head of the fifth metatarsal.

## 2023-04-10 ENCOUNTER — Encounter: Payer: Self-pay | Admitting: Podiatry

## 2023-04-10 ENCOUNTER — Ambulatory Visit (INDEPENDENT_AMBULATORY_CARE_PROVIDER_SITE_OTHER): Payer: 59

## 2023-04-10 ENCOUNTER — Ambulatory Visit (INDEPENDENT_AMBULATORY_CARE_PROVIDER_SITE_OTHER): Payer: 59 | Admitting: Podiatry

## 2023-04-10 DIAGNOSIS — Z9889 Other specified postprocedural states: Secondary | ICD-10-CM | POA: Diagnosis not present

## 2023-04-10 DIAGNOSIS — M86672 Other chronic osteomyelitis, left ankle and foot: Secondary | ICD-10-CM

## 2023-04-10 DIAGNOSIS — S92902K Unspecified fracture of left foot, subsequent encounter for fracture with nonunion: Secondary | ICD-10-CM

## 2023-04-10 MED ORDER — GABAPENTIN 300 MG PO CAPS: 300.0000 mg | ORAL_CAPSULE | Freq: Three times a day (TID) | ORAL | 3 refills | Status: DC

## 2023-04-10 NOTE — Progress Notes (Signed)
  Subjective:  Patient ID: Tyler Rangel, male    DOB: 10-07-1960,  MRN: [id number]  DOS: 02/21/2023 Procedure: Repeat irrigation and excisional debridement with partial resection of fifth metatarsal, left foot; peroneus longus to cuboid tendon transfer left foot; antibiotic bead exchange with insertion of vancomycin  and tobramycin  impregnated dissolvable antibiotic drug delivery device left foot  63 y.o. male returns for post-op check.  Patient returns for first postop check status post above procedures.  Previously underwent multiple debridements bone biopsy and hardware removal of the fifth metatarsal proximal half.  Recently finished IV antibiotics on December 26 has been nonweightbearing to the left lower extremity in cam boot.  He is now back to weightbearing in the cam boot with crutch assist.  He still does have a lot of pain putting burning tingling numbness along the outside of his left foot.   Review of Systems: Negative except as noted in the HPI. Denies N/V/F/Ch.   Objective:  There were no vitals filed for this visit. There is no height or weight on file to calculate BMI. Constitutional Well developed. Well nourished.  Vascular Foot warm and well perfused. Capillary refill normal to all digits.  Calf is soft and supple, no posterior calf or knee pain, negative Homans' sign  Neurologic Normal speech. Oriented to person, place, and time. Epicritic sensation to light touch grossly present bilaterally.  Dermatologic Skin healing well without signs of infection. Skin edges well coapted without signs of infection.  Orthopedic: Decreased tenderness to palpation noted about the surgical site.  Mild to moderate edema on the lateral midfoot and rear foot.  Patient can evert and invert his foot without significant pain.   Multiple view plain film radiographs: April 09, 2023 XR 3 views left foot AP lateral oblique nonweightbearing.  Findings: No obvious evidence of osseous erosions or  osteolysis in the fourth metatarsal base or cuboid area. Assessment:   1. Post-operative state   2. 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 Other chronic osteomyelitis of left foot (HCC)     Plan:  Patient was evaluated and treated and all questions answered.  S/p foot surgery left foot repeat debridement resection of fifth metatarsal proximal two thirds and peroneus longus to cuboid tendon transfer with wound closure -Progressing as expected post-operatively.  Still with some pain in the left foot near the surgical site I believe mostly related to nerve pain. -XR: No evidence of osteomyelitis residually in the fourth metatarsal base or cuboid at this time -WB Status: Weightbearing as in cam boot and can transition back to regular shoes as able -Incision doing well without wound complication or evidence of sinus tract or drainage -Medications: Finished IV antibiotics per ID no further antibiotics indicated at this time -Overall patient continues to improve we will have him back in a month for further recheck to assess clinical progress         Marolyn JULIANNA Honour, DPM [facility] / [facility]

## 2023-04-22 ENCOUNTER — Ambulatory Visit: Payer: 59 | Admitting: Infectious Diseases

## 2023-04-22 ENCOUNTER — Other Ambulatory Visit: Payer: Self-pay

## 2023-04-22 VITALS — BP 138/82 | HR 61 | Temp 97.9°F

## 2023-04-22 DIAGNOSIS — M86672 Other chronic osteomyelitis, left ankle and foot: Secondary | ICD-10-CM | POA: Diagnosis not present

## 2023-04-22 DIAGNOSIS — Z79899 Other long term (current) drug therapy: Secondary | ICD-10-CM | POA: Diagnosis not present

## 2023-04-22 DIAGNOSIS — M79672 Pain in left foot: Secondary | ICD-10-CM | POA: Diagnosis not present

## 2023-04-22 NOTE — Progress Notes (Signed)
 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) 02/14/2023   Diabetes mellitus, new onset (HCC) 02/13/2023   Osteomyelitis (HCC) 02/13/2023   Chronic pain syndrome 02/10/2023   Chronic foot pain, left 02/10/2023   Ankle weakness 02/10/2023   Chronic bilateral low back pain without sciatica 02/10/2023   Hardware complicating wound infection (HCC) 02/04/2023   Encounter for therapeutic drug monitoring 02/04/2023   Encounter for orthopedic follow-up care 10/21/2018   Impingement syndrome of shoulder region 06/09/2018   Pain in joint of right shoulder 03/16/2018    Patient's Medications  New Prescriptions   No medications on file  Previous Medications   ACETAMINOPHEN  (TYLENOL ) 500 MG TABLET    Take 2 tablets (1,000 mg total) by mouth every 6 (six) hours.   ATORVASTATIN  (LIPITOR) 40 MG TABLET    Take 1 tablet (40 mg total) by mouth daily.   CYCLOBENZAPRINE  (FLEXERIL ) 5 MG TABLET    Take 1 tablet (5 mg total) by mouth 3 (three) times daily as needed for muscle spasms.   ESOMEPRAZOLE (NEXIUM) 40 MG CAPSULE    Take 40 mg by mouth daily.   GABAPENTIN  (NEURONTIN ) 300 MG CAPSULE    Take 1 capsule (300 mg total) by mouth 3 (three) times daily.   GABAPENTIN  (NEURONTIN ) 400 MG CAPSULE    Take 1 capsule (400 mg total) by mouth 3 (three) times daily.   HYDROCHLOROTHIAZIDE  (MICROZIDE ) 12.5 MG CAPSULE    Take 12.5 mg by mouth daily.   IBUPROFEN  (ADVIL ) 600 MG TABLET    Take 1 tablet (600 mg total) by mouth every 6 (six) hours as needed for mild pain (pain score 1-3) or moderate pain (pain score 4-6).   LISINOPRIL (ZESTRIL) 2.5 MG TABLET        LORATADINE (CLARITIN) 10 MG TABLET    Take 10 mg by mouth daily.   MELOXICAM  (MOBIC ) 15 MG TABLET    Take 15 mg by mouth every morning.   METFORMIN  (GLUCOPHAGE -XR) 500 MG 24 HR TABLET    Take 1 tablet (500 mg total) by mouth 2 (two) times daily with a meal.   TAMSULOSIN  (FLOMAX ) 0.4 MG CAPS CAPSULE    Take 1 capsule (0.4 mg total) by mouth daily.  Modified Medications   No medications on file  Discontinued Medications   No medications on file    Subjective: 63 year old male with type II DM with history of having a fall with fifth metatarsal fracture requiring hardware placement status post on and off oral antibiotics followed in clinic by myself as well as Dr. Malvin who is here for HFU ( 11/7-11/18) after elective admission for hardware removal of left foot of the nonunion area of the fifth metatarsal. See my note on 10/29 for details.  He underwent multiple surgical interventions by Podiatry 11/9, 11/12 and 11/15 with cultures growing MRSE, MS staph caprae and discharged on 11/18 to complete 6 weeks course of daptomycin .  Continues to be on IV daptomycin  through PICC without concerns. Following Podiatry.   12/20 Getting IV daptomycin  without any concerns related to PICC or abtx. Left foot has healed, some numbness around the surgical site  and feels touchy. He feels dull ache when standing, uses crutches to walk and puts 50% of weight on both legs. Last seen by Podiatry. Wound healing as expected post op and no signs of infection. Xray 03/13/2023: osteolysis of the fourth metatarsal base however not definitive for OM. No fracture or other acute osseous issue noted. He is happy with the progress and no complaints today.   04/22/23 Completed IV abtx 12/26 and PICC line has been removed. Last seen by Podiatry 1/2 with no concerns for infection. Xray 1/1 No obvious evidence of osseous erosions or osteolysis in the fourth metatarsal base or cuboid area. He however has some ongoing pain at  the left lateral foot which was thought to be nerve pain. Reports taking advil , oleve, and gabapentin  for pain control. Pain is worse on standing and walking. Denies fevers, chills, drainage from the wound.   Review of Systems: all systems reviewed including MSK and negative.   Past Medical History:  Diagnosis Date   Diabetes mellitus without complication (HCC)    GERD (gastroesophageal reflux disease)    Past Surgical History:  Procedure Laterality Date   AMPUTATION Left 02/18/2023   Procedure: Repeat irrigation and debridement, resection of 5th metatarsal, antibiotic beads;  Surgeon: Malvin Marsa FALCON, DPM;  Location: [facility] OR;  Service: Orthopedics/Podiatry;  Laterality: Left;  Left foot resection of 5th metatarsal base, repeat washout, antibiotic beads stimulan   FOOT SURGERY Left    HARDWARE REMOVAL Left 02/15/2023   Procedure: HARDWARE REMOVAL with BONE BIOPSY.;  Surgeon: Malvin Marsa FALCON, DPM;  Location: [facility] OR;  Service: Orthopedics/Podiatry;  Laterality: Left;   IRRIGATION AND DEBRIDEMENT FOOT Left 02/21/2023   Procedure: REPEAT IRRIGATION AND DEBRIDEMENT FOOT WITH ANTIBIOTIC BEAD EXCHANGE;  Surgeon: Malvin Marsa FALCON, DPM;  Location: [facility] OR;  Service: Orthopedics/Podiatry;  Laterality: Left;   TENDON REPAIR Left 02/21/2023   Procedure: TENDON TRANSFER;  Surgeon: Malvin Marsa FALCON, DPM;  Location: [facility] OR;  Service: Orthopedics/Podiatry;  Laterality: Left;   Social History   Tobacco Use   Smoking status: Never   Smokeless tobacco: Never  Vaping Use   Vaping status: Never Used  Substance Use Topics   Alcohol use: Never   Drug use: Never    No family history on file.  Allergies  Allergen Reactions   Hydrocodone Nausea And Vomiting    Health Maintenance  Topic Date Due   COVID-19 Vaccine (1) Never done   Pneumococcal Vaccine 40-67 Years old (1 of 2 - PCV) Never done   FOOT EXAM  Never done   OPHTHALMOLOGY EXAM  Never done   Diabetic kidney  evaluation - Urine ACR  Never done   Hepatitis C Screening  Never done   DTaP/Tdap/Td (1 - Tdap) Never done   Zoster Vaccines- Shingrix (1 of 2) Never done   Colonoscopy  Never done   INFLUENZA VACCINE  Never done   HEMOGLOBIN A1C  08/14/2023   Diabetic kidney evaluation - eGFR measurement  02/22/2024   HIV Screening  Completed   HPV VACCINES  Aged Out    Objective: BP 138/82   Pulse 61   Temp 97.9 F (36.6 C) (Oral)   SpO2 98%   Physical Exam Constitutional:      Appearance: Normal appearance.  HENT:     Head: Normocephalic and atraumatic.      Mouth: Mucous membranes are moist.  Eyes:    Conjunctiva/sclera: Conjunctivae normal.     Pupils:  Cardiovascular:     Rate and  Rhythm: Normal rate and regular rhythm.     Heart sounds:   Pulmonary:     Effort: Pulmonary effort is normal.     Breath sounds:   Abdominal:     General: Non distended     Palpations:   Musculoskeletal:        General: Normal range of motion. Ambulatory. Left lateral foot wound has healed with no signs of infection   Skin:    General: Skin is warm and dry.     Comments:   Neurological:     General: grossly non focal     Mental Status: awake, alert and oriented to person, place, and time.   Psychiatric:        Mood and Affect: Mood normal.   Lab Results Lab Results  Component Value Date   WBC 9.5 02/22/2023   HGB 13.3 02/22/2023   HCT 36.2 (L) 02/22/2023   MCV 87.4 02/22/2023   PLT 220 02/22/2023    Lab Results  Component Value Date   CREATININE 0.73 02/22/2023   BUN 17 02/22/2023   NA 133 (L) 02/22/2023   K 4.0 02/22/2023   CL 104 02/22/2023   CO2 22 02/22/2023    Lab Results  Component Value Date   ALT 38 02/13/2023   AST 24 02/13/2023   ALKPHOS 116 02/13/2023   BILITOT 1.3 (H) 02/13/2023    Lab Results  Component Value Date   CHOL 152 02/14/2023   HDL 27 (L) 02/14/2023   LDLCALC 50 02/14/2023   TRIG 375 (H) 02/14/2023   CHOLHDL 5.6 02/14/2023   No results  found for: LABRPR, RPRTITER No results found for: HIV1RNAQUANT, HIV1RNAVL, CD4TABS   Assessment/Plan 63 year old male with type II DM with history of having a fall with fifth metatarsal fracture requiring hardware placement status post on and off oral antibiotics followed in clinic by myself as well as Dr. Malvin who is electively admitted for hardware removal of left foot of the nonunion area of the fifth metatarsal. See my note on 10/29 for details.    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 CX with MRSE and staph caprae, gram strain with GNR and GPC. Path fifth proximal metatarsal margin with chronic osteomyelitis 11/12 s/p I&D with resection of proximal half of fifth metatarsal base, application of antibiotic beads. path with distal resection margin with chronic osteomyelitis and focal evidence of acute osteomyelitis. The previously inked margin is negative for acute osteomyelitis - Per Dr Malvin all metallic removed, there is a small anchor in the cuboid to anchor the tendon transfer .  - s/p completion of 6 weeks of Daptomycinm EOT 12/26 - 1/15 wound continues to remain healed - Fu with us  as needed  # Left lateral foot pain - on analgesics and gabapentin  - Podiatry managing   # Medication Monitoring  - 12/20 CRP 6, CBC, CMP and CPK unremarkable   # PICC  - removed   I have personally spent 30 minutes involved in face-to-face and non-face-to-face activities for this patient on the day of the visit. Professional time spent includes the following activities: Preparing to see the patient (review of tests), Obtaining and/or reviewing separately obtained history (admission/discharge record), Performing a medically appropriate examination and/or evaluation , Ordering medications/tests/procedures, referring and  communicating with other health care professionals, Documenting clinical information in the EMR, Independently interpreting  results (not separately reported), Communicating results to the patient/family/caregiver, Counseling and educating the patient/family/caregiver and Care coordination (not separately reported).   Of note, portions of this note may have been created with voice recognition software. While this note has been edited for accuracy, occasional wrong-word or 'sound-a-like' substitutions may have occurred due to the inherent limitations of voice recognition software.   Annalee Joseph, MD [facility] 04/22/2023, 1:59 PM

## 2023-04-23 DIAGNOSIS — M79672 Pain in left foot: Secondary | ICD-10-CM | POA: Insufficient documentation

## 2023-05-12 ENCOUNTER — Telehealth: Payer: Self-pay | Admitting: *Deleted

## 2023-05-12 ENCOUNTER — Encounter: Payer: 59 | Attending: Physical Medicine and Rehabilitation | Admitting: Physical Medicine and Rehabilitation

## 2023-05-12 VITALS — BP 148/78 | HR 62 | Ht 66.0 in | Wt 181.0 lb

## 2023-05-12 DIAGNOSIS — M792 Neuralgia and neuritis, unspecified: Secondary | ICD-10-CM

## 2023-05-12 DIAGNOSIS — G8929 Other chronic pain: Secondary | ICD-10-CM

## 2023-05-12 DIAGNOSIS — G894 Chronic pain syndrome: Secondary | ICD-10-CM | POA: Diagnosis present

## 2023-05-12 DIAGNOSIS — K521 Toxic gastroenteritis and colitis: Secondary | ICD-10-CM | POA: Diagnosis not present

## 2023-05-12 DIAGNOSIS — M79672 Pain in left foot: Secondary | ICD-10-CM | POA: Insufficient documentation

## 2023-05-12 MED ORDER — TRAMADOL HCL 50 MG PO TABS: 50.0000 mg | ORAL_TABLET | Freq: Three times a day (TID) | ORAL | 2 refills | Status: DC | PRN

## 2023-05-12 MED ORDER — PREGABALIN 50 MG PO CAPS
100.0000 mg | ORAL_CAPSULE | Freq: Two times a day (BID) | ORAL | 2 refills | Status: DC
Start: 2023-05-12 — End: 2023-07-30

## 2023-05-12 NOTE — Telephone Encounter (Signed)
Called pharmacy, no questions regarding tramadol, confirmed Lyrica script and DC gabapentin. Thanks!

## 2023-05-12 NOTE — Progress Notes (Unsigned)
Subjective:    Patient ID: Tyler Rangel, male    DOB: July 16, 1960, 63 y.o.   MRN: [id number]  HPI   Tyler Rangel is a 63 y.o. year old male  who  has a past medical history of Diabetes mellitus without complication (HCC) and GERD (gastroesophageal reflux disease).   They are presenting to PM&R clinic for follow up related to  L lateral foot pain s/p ORIF and subsequent infection of 5th metatarsal .   Plan from last visit:  Chronic pain syndrome Encounter for therapeutic drug monitoring Encounter for opiate analgesic use agreement Norco discontinued by his hospital, states he has no chronic pain needs at this time. Continue Flexeril 3 times daily, gabapentin 300 mg 3 times daily, Mobic 15 mg daily; would limit ibuprofen 600 mg as needed rarely or never given Mobic, which is as frequently as patient uses it   Patient to call clinic if he has any chronic pain needs over Fannie Knee medication.  Follow-up in 2 months.   Abnormality of gait and mobility Chronic foot pain, left Subacute osteomyelitis, unspecified site [facility]) Being followed with infectious disease and podiatry as above for treatment of osteomyelitis   Discussed patient with Dr. Annamary Rummage while in office, sent photos of incision site as above, he is to call the patient's for staple removal by the end of this week and for follow-up.   Redressed left foot with gauze and Ace wrap in clinic.   Interval Hx:  - Therapies: Pain improving to where he can walk on it now; he is limited if he tries to stay on his feet all day to a deep aching, which is debilitating. He can stay on his feet for about 12 hours - "that's the absolute limit".   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 "If it fills up, I can sit down a second or two, and the bathrooms aren't far."    - Follow  ups: ID 1/14:   Completed IV abtx 12/26 and PICC line has been removed. Last seen by Podiatry 1/2 with no concerns for infection. Xray 1/1 No obvious evidence of osseous erosions or osteolysis in the fourth metatarsal base or cuboid area. He however has some ongoing pain at the left lateral foot which was thought to be nerve pain. Reports taking advil, oleve, and gabapentin for pain control. Pain is worse on standing and walking. Denies fevers, chills, drainage from the wound.   Dr. Annamary Rummage 04/10/23: -Progressing as expected post-operatively.  Still with some pain in the left foot near the surgical site I believe mostly related to nerve pain. -XR: No evidence of osteomyelitis residually in the fourth metatarsal base or cuboid at this time -WB Status: Weightbearing as in cam boot and can transition back to regular shoes as able -Incision doing well without wound complication or evidence of sinus tract or drainage -Medications: Finished IV antibiotics per ID no further antibiotics indicated at this time -Overall patient continues to improve we will have him back in a month for further recheck to assess clinical progress    - Falls: none   - DME: Has orthotic inserts he got a year ago; thinks he needs something new to offload the outside of his foot where his surgery was. Is out of the CAM  boot and in normal shoes at this point.    - Medications: "The pain has gotten so much better in the last 3 weeks - it's still there, but not nearly as bad."  Gabapentin - takes 1 in the morning, may take another 2 hours later, and another at nighttime and maybe a fourth dose; he can't swallow the 400 mg capsules because they are too large so he takes 300 mg. He is unsure if they last long enough or "if they even do anything anymore".   Aleve - 2 in the am, and 2 in the PM  Keeping sugars under control; 90-200s, usually around 120. He's working on drinking lots of water and avoiding sugar where he can. He is  having LOTs of gastrointestinal distress with metformin - uncontrollable diarrhea with certain foods like salads. He cannot take it before he goes to work "because if it hit me I would not be able to make it to work".    - Other concerns: He is getting a blister on the top of his right foot due to shoes rubbing on his    Pain Inventory Average Pain 0 Pain Right Now 0 My pain is sharp, dull, stabbing, and only hurts when standing for long periods of time   In the last 24 hours, has pain interfered with the following? General activity 2 Relation with others 0 Enjoyment of life 2 What TIME of day is your pain at its worst? varies Sleep (in general) Fair  Pain is worse with: walking, standing, and some activites Pain improves with: rest and medication Relief from Meds: 5  No family history on file. Social History   Socioeconomic History   Marital status: Married    Spouse name: Not on file   Number of children: Not on file   Years of education: Not on file   Highest education level: Not on file  Occupational History   Not on file  Tobacco Use   Smoking status: Never   Smokeless tobacco: Never  Vaping Use   Vaping status: Never Used  Substance and Sexual Activity   Alcohol use: Never   Drug use: Never   Sexual activity: Not on file  Other Topics Concern   Not on file  Social History Narrative   Not on file   Social Drivers of Health   Financial Resource Strain: Not on file  Food Insecurity: No Food Insecurity (02/13/2023)   Hunger Vital Sign    Worried About Running Out of Food in the Last Year: Never true    Ran Out of Food in the Last Year: Never true  Transportation Needs: No Transportation Needs (02/13/2023)   PRAPARE - Administrator, Civil Service (Medical): No    Lack of Transportation (Non-Medical): No  Physical Activity: Not on file  Stress: Not on file  Social Connections: Not on file   Past Surgical History:  Procedure Laterality Date    AMPUTATION Left 02/18/2023   Procedure: Repeat irrigation and debridement, resection of 5th metatarsal, antibiotic beads;  Surgeon: Pilar Plate, DPM;  Location[facility] OR;  Service: Orthopedics/Podiatry;  Laterality: Left;  Left foot resection of 5th metatarsal base, repeat washout, antibiotic beads stimulan   FOOT SURGERY Left    HARDWARE REMOVAL Left 02/15/2023   Procedure: HARDWARE REMOVAL with BONE BIOPSY.;  Surgeon: Pilar Plate, DPM;  Location: [facility] OR;  Service: Orthopedics/Podiatry;  Laterality: Left;   IRRIGATION AND DEBRIDEMENT FOOT Left 02/21/2023   Procedure: REPEAT  IRRIGATION AND DEBRIDEMENT FOOT WITH ANTIBIOTIC BEAD EXCHANGE;  Surgeon: Pilar Plate, DPM;  Location: [facility] OR;  Service: Orthopedics/Podiatry;  Laterality: Left;   TENDON REPAIR Left 02/21/2023   Procedure: TENDON TRANSFER;  Surgeon: Pilar Plate, DPM;  Location: [facility] OR;  Service: Orthopedics/Podiatry;  Laterality: Left;   Past Surgical History:  Procedure Laterality Date   AMPUTATION Left 02/18/2023   Procedure: Repeat irrigation and debridement, resection of 5th metatarsal, antibiotic beads;  Surgeon: Pilar Plate, DPM;  Location: [facility] OR;  Service: Orthopedics/Podiatry;  Laterality: Left;  Left foot resection of 5th metatarsal base, repeat washout, antibiotic beads stimulan   FOOT SURGERY Left    HARDWARE REMOVAL Left 02/15/2023   Procedure: HARDWARE REMOVAL with BONE BIOPSY.;  Surgeon: Pilar Plate, DPM;  Location: [facility] OR;  Service: Orthopedics/Podiatry;  Laterality: Left;   IRRIGATION AND DEBRIDEMENT FOOT Left 02/21/2023   Procedure: REPEAT IRRIGATION AND DEBRIDEMENT FOOT WITH ANTIBIOTIC BEAD EXCHANGE;  Surgeon: Pilar Plate, DPM;  Location: [facility] OR;  Service: Orthopedics/Podiatry;  Laterality: Left;   TENDON REPAIR Left 02/21/2023   Procedure: TENDON TRANSFER;  Surgeon: Pilar Plate, DPM;  Location: [facility] OR;  Service: Orthopedics/Podiatry;   Laterality: Left;   Past Medical History:  Diagnosis Date   Diabetes mellitus without complication (HCC)    GERD (gastroesophageal reflux disease)    Pulse 62   Ht 5\' 6"  (1.676 m)   Wt 181 lb (82.1 kg)   SpO2 99%   BMI 29.21 kg/m   Opioid Risk Score:   Fall Risk Score:  `1  Depression screen [facility] 2/9     05/12/2023    1:33 PM 03/28/2023   11:24 AM 02/10/2023    9:09 AM  Depression screen PHQ 2/9  Decreased Interest 0 0 0  Down, Depressed, Hopeless 0 0 0  PHQ - 2 Score 0 0 0  Altered sleeping   0  Tired, decreased energy   0  Change in appetite   0  Feeling bad or failure about yourself    0  Trouble concentrating   0  Moving slowly or fidgety/restless   0  Suicidal thoughts   0  PHQ-9 Score   0      Review of Systems  Musculoskeletal:  Positive for gait problem.  All other systems reviewed and are negative.     Objective:   Physical Exam   L lateral foot  - well healed, scarred Abscence of light touch over scar line TTP over plantar lateral surface  AROM WNL     Assessment & Plan:

## 2023-05-12 NOTE — Patient Instructions (Addendum)
Start Lyrica 50 mg capsules 2 capsules twice daily.  Stop gabapentin.  After 2 weeks, if you are not having major side effects but need the medication increased, contact the clinic and we will assist you in titrating this medication.  If you experience severe side effects, switch back to gabapentin.  Since you are taking Aleve as needed for pain, stop Meloxicam due to risk of GI bleeding.   I have sent in a prescription for tramadol 50 mg up to 3 times daily as needed for severe pain.  Use this to tolerate when you are on your feet and moving often.  If this works well for you, at follow-up in 3 months, we will do a pain contract and work towards weaning you to minimal tolerable doses to allow you to function and work.  I did send a prescription to [facility] to redo your shoe inserts, to help offload the outside of your left foot.  Talk to your primary care doctor about switching off of metformin and onto another oral medication to keep your blood sugars under control.  Follow-up with me in 3 months.

## 2023-05-12 NOTE — Telephone Encounter (Signed)
Brandy from Pharmacy called to say that they have some questions about the Tramadol sent over on Tyler Rangel. He just picked up an Rx for gabapentin  300 and was not sure if you were aware. Please call the pharmacy to discuss # [phone]

## 2023-05-18 DIAGNOSIS — K521 Toxic gastroenteritis and colitis: Secondary | ICD-10-CM | POA: Insufficient documentation

## 2023-05-20 ENCOUNTER — Encounter: Payer: Self-pay | Admitting: Podiatry

## 2023-05-20 ENCOUNTER — Ambulatory Visit (INDEPENDENT_AMBULATORY_CARE_PROVIDER_SITE_OTHER): Payer: 59 | Admitting: Podiatry

## 2023-05-20 ENCOUNTER — Telehealth: Payer: Self-pay

## 2023-05-20 DIAGNOSIS — M86672 Other chronic osteomyelitis, left ankle and foot: Secondary | ICD-10-CM

## 2023-05-20 DIAGNOSIS — Z9889 Other specified postprocedural states: Secondary | ICD-10-CM

## 2023-05-20 DIAGNOSIS — S92902K Unspecified fracture of left foot, subsequent encounter for fracture with nonunion: Secondary | ICD-10-CM

## 2023-05-20 NOTE — Progress Notes (Signed)
 Subjective:  Patient ID: Tyler Rangel, male    DOB: 09-26-1960,  MRN: [id number]  DOS: 02/21/2023 Procedure: Repeat irrigation and excisional debridement with partial resection of fifth metatarsal, left foot; peroneus longus to cuboid tendon transfer left foot; antibiotic bead exchange with insertion of vancomycin and tobramycin impregnated dissolvable antibiotic drug delivery device left foot  63 y.o. male returns for post-op check.  Patient returns for postop check now approximately 3 months status post above procedures.  He reports he is doing very well at this visit.  He has much less pain than he did previously and rates it about a 5-6 out of 10 when he is walking around a lot but at rest or for short distances he does not have much of any pain.  He has now taking Lyrica for nerve pain which she says it helped a lot as well.  He is also controlling his blood sugars and they have been averaging around 10 5-1 15 and is making a lot of life changes to control them.  He denies any redness swelling drainage or wound at the left foot surgical site.  He is set to return to work in early March.  He is doing well without concerns.  Very happy that he still has his foot and is continuing to improve in regards to his pain and his ability to weight-bear further.   Review of Systems: Negative except as noted in the HPI. Denies N/V/F/Ch.   Objective:  There were no vitals filed for this visit. There is no height or weight on file to calculate BMI. Constitutional Well developed. Well nourished.  Vascular Foot warm and well perfused. Capillary refill normal to all digits.  Calf is soft and supple, no posterior calf or knee pain, negative Homans' sign  Neurologic Normal speech. Oriented to person, place, and time. Epicritic sensation to light touch grossly present bilaterally.  Dermatologic Skin well-healed without any opening drainage erythema or sign of residual infection   Orthopedic: Much improved in  regards to plantarflexion dorsiflexion eversion inversion range of motion and he does have all of these motions available with 4 out of 5 strength with all compartments.  Some tenderness with inversion though not pronounced.  No significant edema or any appreciable erythema at the surgical site on the lateral aspect of the left foot.   Multiple view plain film radiographs: XR deferred at this visit. Assessment:   1. Post-operative state   2. Other chronic osteomyelitis of left foot (HCC)   3. 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  S/p foot surgery left foot repeat debridement resection of fifth metatarsal proximal two thirds and peroneus longus to cuboid tendon transfer with wound closure -Much improved from prior with decreased pain that is being managed well with Lyrica as well as anti-inflammatories as needed. -Continue to progress his range of motion and weightbearing as he is able.  He is walking in regular shoe at this time.  Continue with this.  Continue range of motion exercises and resistance training with inversion eversion dorsiflexion plantarflexion and lieu of formal physical therapy -XR: Deferred at this visit will consider at future follow-up visit if any issues -WB Status: Weightbearing a tolerated in regular shoe gear -Medications: No antibiotics indicated.  Take Tylenol or ibuprofen as needed for pain continue Lyrica per pain management specialist to be seen -Very happy with outcome at this time patient  is satisfied and is walking without any issues for the most part pain has continued to decrease and is slated to go back to work next month and is confident he will be able to do so.  Will have him follow-up in 3 months for final long-term follow-up.  Call with any questions or concerns arise as he begins to do more activity at work.         Corinna Gab, DPM [facility] / [facility]

## 2023-05-20 NOTE — Telephone Encounter (Signed)
Tramadol PA submitted   Tyler Rangel (Key: [id number])

## 2023-06-06 NOTE — Telephone Encounter (Signed)
 Outcome Approved on February 11 by [employer] NCPDP 2017 Your PA request has been approved. Additional information will be provided in the approval communication. (Message 1145) Authorization Expiration Date: 11/16/2023

## 2023-06-09 ENCOUNTER — Telehealth: Payer: Self-pay | Admitting: Podiatry

## 2023-06-09 ENCOUNTER — Encounter: Payer: Self-pay | Admitting: Podiatry

## 2023-06-09 NOTE — Telephone Encounter (Signed)
 Patient called and wanted a corrected note faxed over to his job. I faxed it to the number the patient gave to me.

## 2023-06-24 ENCOUNTER — Ambulatory Visit: Payer: 59

## 2023-06-24 NOTE — Progress Notes (Signed)
  Patient was present and evaluated for Custom molded foot orthotics. Patient will benefit from CFO's to provide total contact to BIL MLA's helping to balance and distribute body weight more evenly across BIL feet helping to reduce plantar pressure and pain. Orthotic will also encourage FF / RF alignment  Patient was scanned today and will return for fitting upon receipt

## 2023-07-30 ENCOUNTER — Other Ambulatory Visit: Payer: Self-pay | Admitting: Physical Medicine and Rehabilitation

## 2023-07-30 NOTE — Telephone Encounter (Signed)
 PMP was Reviewed.  Pregabalin  e-scribed

## 2023-08-04 ENCOUNTER — Encounter: Payer: Self-pay | Admitting: Physical Medicine and Rehabilitation

## 2023-08-04 ENCOUNTER — Encounter: Payer: 59 | Attending: Physical Medicine and Rehabilitation | Admitting: Physical Medicine and Rehabilitation

## 2023-08-04 VITALS — BP 123/76 | HR 57 | Ht 66.0 in | Wt 183.2 lb

## 2023-08-04 DIAGNOSIS — Z5181 Encounter for therapeutic drug level monitoring: Secondary | ICD-10-CM | POA: Insufficient documentation

## 2023-08-04 DIAGNOSIS — Z029 Encounter for administrative examinations, unspecified: Secondary | ICD-10-CM | POA: Insufficient documentation

## 2023-08-04 DIAGNOSIS — M79672 Pain in left foot: Secondary | ICD-10-CM | POA: Diagnosis present

## 2023-08-04 DIAGNOSIS — G8929 Other chronic pain: Secondary | ICD-10-CM | POA: Insufficient documentation

## 2023-08-04 NOTE — Progress Notes (Signed)
 Subjective:    Patient ID: Tyler Rangel, male    DOB: 20-Aug-1960, 63 y.o.   MRN: [id number]  Medication Refill   Tyler Rangel is a 63 y.o. year old male  who  has a past medical history of Diabetes mellitus without complication (HCC) and GERD (gastroesophageal reflux disease).   They are presenting to PM&R clinic for follow up related to L lateral foot pain s/p ORIF and subsequent infection of 5th metatarsal .   Plan from last visit:  Chronic pain syndrome   Since you are taking Aleve as needed for pain, stop Meloxicam  due to risk of GI bleeding.    I have sent in a prescription for tramadol  50 mg up to 3 times daily as needed for severe pain.  Use this to tolerate when you are on your feet and moving often.  If this works well for you, at follow-up in 3 months, we will do a pain contract and work towards weaning you to minimal tolerable doses to allow you to function and work.   Chronic foot pain, left -     Ambulatory referral for Orthotics   I did send a prescription to [facility] to redo your shoe inserts, to help offload the outside of your left foot.   Follow up in 3 months   Neuropathic pain   Start Lyrica  50 mg capsules 2 capsules twice daily.  Stop gabapentin .  After 2 weeks, if you are not having major side effects but need the medication increased, contact the clinic and we will assist you in titrating this medication.     If you experience severe side effects, switch back to gabapentin .   Diarrhea due to drug Talk to your primary care doctor about switching off of metformin  and onto another oral medication to keep your blood sugars under control.   Interval Hx:  - Therapies: He is walking longer distances. Notices pain if he stands on it for too long, him sitting down and doing ankle ROM exercises helps. 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 He has a chair at work and can sit while he  works, and a stool to work on.    - Follow ups: Saw Dr. Rosemarie Conquest 2/11; S/p foot surgery left foot repeat debridement resection of fifth metatarsal proximal two thirds and peroneus longus to cuboid tendon transfer with wound closure -Much improved from prior with decreased pain that is being managed well with Lyrica  as well as anti-inflammatories as needed. -Continue to progress his range of motion and weightbearing as he is able.  He is walking in regular shoe at this time.  Continue with this.  Continue range of motion exercises and resistance training with inversion eversion dorsiflexion plantarflexion and lieu of formal physical therapy   - Falls: none  - DME: none, wbat  - Medications: Pain is significantly better, especially sensitivity.  Lyrica  100 mg BID  - doing very well with this.  Tramadol  50 mg  - takes rarely; 1-2x weekly Ibuprofen  and tylenol  - does not take currently because he has so many scheduled medications    - Other concerns: He says he is doing a lot better in terms of urinary urgency since getting his diabetes under control. He is also drinking a lot more water now and he LOVES an additive called Splash with lemon; sugar free. Drinks 3 bottles per day.   Pain  Inventory Average Pain 1 Pain Right Now 1 My pain is sharp, burning, dull, stabbing, and aching  In the last 24 hours, has pain interfered with the following? General activity 1 Relation with others 8 Enjoyment of life 0 What TIME of day is your pain at its worst? evening Sleep (in general) Good  Pain is worse with: walking and standing Pain improves with: rest Relief from Meds: 2  No family history on file. Social History   Socioeconomic History   Marital status: Married    Spouse name: Not on file   Number of children: Not on file   Years of education: Not on file   Highest education level: Not on file  Occupational History   Not on file  Tobacco Use   Smoking status: Never   Smokeless  tobacco: Never  Vaping Use   Vaping status: Never Used  Substance and Sexual Activity   Alcohol use: Never   Drug use: Never   Sexual activity: Not on file  Other Topics Concern   Not on file  Social History Narrative   Not on file   Social Drivers of Health   Financial Resource Strain: Not on file  Food Insecurity: No Food Insecurity (02/13/2023)   Hunger Vital Sign    Worried About Running Out of Food in the Last Year: Never true    Ran Out of Food in the Last Year: Never true  Transportation Needs: No Transportation Needs (02/13/2023)   PRAPARE - Administrator, Civil Service (Medical): No    Lack of Transportation (Non-Medical): No  Physical Activity: Not on file  Stress: Not on file  Social Connections: Not on file   Past Surgical History:  Procedure Laterality Date   AMPUTATION Left 02/18/2023   Procedure: Repeat irrigation and debridement, resection of 5th metatarsal, antibiotic beads;  Surgeon: Evertt Hoe, DPM;  Location: [facility] OR;  Service: Orthopedics/Podiatry;  Laterality: Left;  Left foot resection of 5th metatarsal base, repeat washout, antibiotic beads stimulan   FOOT SURGERY Left    HARDWARE REMOVAL Left 02/15/2023   Procedure: HARDWARE REMOVAL with BONE BIOPSY.;  Surgeon: Evertt Hoe, DPM;  Location: [facility] OR;  Service: Orthopedics/Podiatry;  Laterality: Left;   IRRIGATION AND DEBRIDEMENT FOOT Left 02/21/2023   Procedure: REPEAT IRRIGATION AND DEBRIDEMENT FOOT WITH ANTIBIOTIC BEAD EXCHANGE;  Surgeon: Evertt Hoe, DPM;  Location: [facility] OR;  Service: Orthopedics/Podiatry;  Laterality: Left;   TENDON REPAIR Left 02/21/2023   Procedure: TENDON TRANSFER;  Surgeon: Evertt Hoe, DPM;  Location: [facility] OR;  Service: Orthopedics/Podiatry;  Laterality: Left;   Past Surgical History:  Procedure Laterality Date   AMPUTATION Left 02/18/2023   Procedure: Repeat irrigation and debridement, resection of 5th metatarsal, antibiotic  beads;  Surgeon: Evertt Hoe, DPM;  Location: [facility] OR;  Service: Orthopedics/Podiatry;  Laterality: Left;  Left foot resection of 5th metatarsal base, repeat washout, antibiotic beads stimulan   FOOT SURGERY Left    HARDWARE REMOVAL Left 02/15/2023   Procedure: HARDWARE REMOVAL with BONE BIOPSY.;  Surgeon: Evertt Hoe, DPM;  Location: [facility] OR;  Service: Orthopedics/Podiatry;  Laterality: Left;   IRRIGATION AND DEBRIDEMENT FOOT Left 02/21/2023   Procedure: REPEAT IRRIGATION AND DEBRIDEMENT FOOT WITH ANTIBIOTIC BEAD EXCHANGE;  Surgeon: Evertt Hoe, DPM;  Location: [facility] OR;  Service: Orthopedics/Podiatry;  Laterality: Left;   TENDON REPAIR Left 02/21/2023   Procedure: TENDON TRANSFER;  Surgeon: Evertt Hoe, DPM;  Location: [facility] OR;  Service: Orthopedics/Podiatry;  Laterality:  Left;   Past Medical History:  Diagnosis Date   Diabetes mellitus without complication (HCC)    GERD (gastroesophageal reflux disease)    There were no vitals taken for this visit.  Opioid Risk Score:   Fall Risk Score:  `1  Depression screen [facility] 2/9     05/12/2023    1:33 PM 03/28/2023   11:24 AM 02/10/2023    9:09 AM  Depression screen PHQ 2/9  Decreased Interest 0 0 0  Down, Depressed, Hopeless 0 0 0  PHQ - 2 Score 0 0 0  Altered sleeping   0  Tired, decreased energy   0  Change in appetite   0  Feeling bad or failure about yourself    0  Trouble concentrating   0  Moving slowly or fidgety/restless   0  Suicidal thoughts   0  PHQ-9 Score   0     Review of Systems  Musculoskeletal:        Left foot pain  All other systems reviewed and are negative.      Objective:   Physical Exam   PE: Constitution: Appropriate appearance for age. No apparent distress   Resp: No respiratory distress. No accessory muscle usage. on RA and CTAB Cardio: Well perfused appearance. No peripheral edema. Abdomen: Nondistended. Nontender.   Psych: Appropriate mood and  affect. Neuro: AAOx4. No apparent cognitive deficits   Neurologic Exam:   DTRs: Reflexes were 2+ in bilateral achilles, patella, biceps, BR and triceps. Babinsky: flexor responses b/l.   Hoffmans: negative b/l Sensory exam: revealed normal sensation in all dermatomal regions except Sensation decreased over left lateral foot incision line Motor exam: strength 5/5 throughout bilateral upper extremities and bilateral lower extremities Coordination: Fine motor coordination was normal.   Gait: +antalgic gait , stable pattern, much improved from prior exams.  No use of assistive device.        Assessment & Plan:   Tyler Rangel is a 63 y.o. year old male  who  has a past medical history of Diabetes mellitus without complication (HCC) and GERD (gastroesophageal reflux disease).    They are presenting to PM&R clinic for follow up related to L lateral foot pain s/p ORIF and subsequent infection of 5th metatarsal .   Chronic foot pain, left Encounter for therapeutic drug monitoring Encounter for opiate analgesic use agreement Continue Lyrica  and Tramadol  as prescribed; call the office when you need a refill of the tramadol   Follow up In 6 months; at that time we will likely get labs to look at your renal function of not done by your other doctors.

## 2023-08-04 NOTE — Patient Instructions (Signed)
 Continue Lyrica  and Tramadol  as prescribed; call the office when you need a refill of the tramadol   Follow up In 6 months; at that time we will likely get labs to look at your renal function of not done by your other doctors.

## 2023-08-05 ENCOUNTER — Ambulatory Visit (INDEPENDENT_AMBULATORY_CARE_PROVIDER_SITE_OTHER)

## 2023-08-05 DIAGNOSIS — M2141 Flat foot [pes planus] (acquired), right foot: Secondary | ICD-10-CM

## 2023-08-05 DIAGNOSIS — M2142 Flat foot [pes planus] (acquired), left foot: Secondary | ICD-10-CM

## 2023-08-05 DIAGNOSIS — Z9889 Other specified postprocedural states: Secondary | ICD-10-CM

## 2023-08-05 NOTE — Progress Notes (Signed)
 Patient presents today to pick up custom molded foot orthotics, diagnosed with Pes Planus and Left foot post op status by Dr. Rosemarie Conquest .   Orthotics were dispensed and fit was satisfactory. Reviewed instructions for break-in and wear. Written instructions given to patient.  Patient will follow up as needed.   Britton Cane Cped, CFo, CFm

## 2023-08-19 ENCOUNTER — Ambulatory Visit (INDEPENDENT_AMBULATORY_CARE_PROVIDER_SITE_OTHER): Payer: 59 | Admitting: Podiatry

## 2023-08-19 DIAGNOSIS — Z91199 Patient's noncompliance with other medical treatment and regimen due to unspecified reason: Secondary | ICD-10-CM

## 2023-08-19 NOTE — Progress Notes (Signed)
 NS

## 2023-08-26 ENCOUNTER — Ambulatory Visit (INDEPENDENT_AMBULATORY_CARE_PROVIDER_SITE_OTHER): Admitting: Podiatry

## 2023-08-26 ENCOUNTER — Encounter: Payer: Self-pay | Admitting: Podiatry

## 2023-08-26 DIAGNOSIS — M86672 Other chronic osteomyelitis, left ankle and foot: Secondary | ICD-10-CM

## 2023-08-26 DIAGNOSIS — S92902K Unspecified fracture of left foot, subsequent encounter for fracture with nonunion: Secondary | ICD-10-CM

## 2023-08-26 DIAGNOSIS — Z9889 Other specified postprocedural states: Secondary | ICD-10-CM

## 2023-08-26 NOTE — Progress Notes (Signed)
 Subjective:  Patient ID: Tyler Rangel, male    DOB: March 01, 1961,  MRN: [id number]  DOS: 02/21/2023 Procedure: Repeat irrigation and excisional debridement with partial resection of fifth metatarsal, left foot; peroneus longus to cuboid tendon transfer left foot; antibiotic bead exchange with insertion of vancomycin  and tobramycin  impregnated dissolvable antibiotic drug delivery device left foot  63 y.o. male returns for post-op check.  Patient returns for postop check now approximately 6 months status post above procedures.  He reports he is doing very well at this visit.   Reports he is back to work full schedule and has worked 19 days in a row without any issues.  Continuing to take Lyrica  and ibuprofen  as needed for pain seeing a pain specialist.  He reports no issues with medications and says he is doing well on them.  He has no concerns with regards to the left foot he is walking without any significant discomfort or disturbance.  Does report he feels like left ankle is slightly loose and turns in/sprains easily.  He is very happy and appreciated with the surgical work that has been done to save his left foot and feels grateful to have a foot at this point in time and is able to be working without significant pain or limitation.   Review of Systems: Negative except as noted in the HPI. Denies N/V/F/Ch.   Objective:  There were no vitals filed for this visit. There is no height or weight on file to calculate BMI. Constitutional Well developed. Well nourished.  Vascular Foot warm and well perfused. Capillary refill normal to all digits.  Calf is soft and supple, no posterior calf or knee pain, negative Homans' sign  Neurologic Normal speech. Oriented to person, place, and time. Epicritic sensation to light touch grossly present bilaterally.  Dermatologic Skin well-healed without any opening drainage erythema or sign of residual infection   Orthopedic: Stable improvement in regards to  plantarflexion dorsiflexion eversion inversion range of motion and he does have all of these motions available with 4 out of 5 strength with all compartments.  Some tenderness with inversion though not pronounced.  No significant edema or any appreciable erythema at the surgical site on the lateral aspect of the left foot.   Multiple view plain film radiographs: XR deferred at this visit. Assessment:   1. Other chronic osteomyelitis of left foot (HCC)   2. 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 Post-operative state       Plan:  Patient was evaluated and treated and all questions answered.  S/p foot surgery left foot repeat debridement resection of fifth metatarsal proximal two thirds and peroneus longus to cuboid tendon transfer with wound closure -Much improved from prior with decreased pain that is being managed well with Lyrica  as well as anti-inflammatories as needed. - Walking in regular shoes without any issue continue strengthening and stretching as needed -Recommend ankle brace for left lower extremity to prevent any instability when ambulating especially on uneven surfaces  -XR: Deferred at this visit will consider at future follow-up visit if any issues -WB Status: Weightbearing a tolerated in regular shoe gear -Medications: No antibiotics indicated.  Take Tylenol  or ibuprofen  as needed for pain continue Lyrica  per pain management specialist  - Patient fully back to work without any issues or concerns, he is very happy with surgical outcome and happy to have his foot. - Follow-up as needed        Maridee Shoemaker, DPM [facility] / [facility]

## 2023-10-01 ENCOUNTER — Telehealth: Payer: Self-pay | Admitting: Podiatry

## 2023-10-01 NOTE — Telephone Encounter (Signed)
 Patient came in day requesting to extend his handicap placard. Can we do that or does he need appointment/ thank you

## 2023-10-13 NOTE — Telephone Encounter (Signed)
 Patient called again requesting a handicap placard.

## 2023-10-17 ENCOUNTER — Telehealth: Payer: Self-pay | Admitting: Podiatry

## 2023-10-17 NOTE — Telephone Encounter (Signed)
 Handicap placard is completed. Patient was contacted and said he would pick it up Monday. SABRA

## 2023-10-28 ENCOUNTER — Ambulatory Visit: Admitting: Podiatry

## 2023-10-28 ENCOUNTER — Encounter: Payer: Self-pay | Admitting: Podiatry

## 2023-10-28 DIAGNOSIS — M2011 Hallux valgus (acquired), right foot: Secondary | ICD-10-CM | POA: Diagnosis not present

## 2023-10-28 DIAGNOSIS — M205X1 Other deformities of toe(s) (acquired), right foot: Secondary | ICD-10-CM

## 2023-10-28 DIAGNOSIS — M2041 Other hammer toe(s) (acquired), right foot: Secondary | ICD-10-CM | POA: Diagnosis not present

## 2023-10-28 NOTE — Progress Notes (Signed)
  Subjective:  Patient ID: Tyler Rangel, male    DOB: 15-Nov-1960,  MRN: [id number]  Chief Complaint  Patient presents with   Foot Pain    Right foot. 2nd toe rubbing top of shoe. Bought a toe spreader and caused a blister, which has since healed. He would like to know if you have any recommendations for the area that is rubbing the shoe. Last A1c: 9.0. No anticoag.     63 y.o. male presents with concern for blistering between the right great toe and second toe.  He noticed some blistering due to the callus rubbing together due to the second toe overriding the great toe.  He says his left foot is doing well he is working without any issues  Past Medical History:  Diagnosis Date   Diabetes mellitus without complication (HCC)    GERD (gastroesophageal reflux disease)     Allergies  Allergen Reactions   Hydrocodone Nausea And Vomiting    ROS: Negative except as per HPI above  Objective:  General: AAO x3, NAD  Dermatological: Prior blistering at the plantar lateral aspect of the left great toe no open wound no blisters present.  Vascular:  Dorsalis Pedis artery and Posterior Tibial artery pedal pulses are 2/4 bilateral.  Capillary fill time < 3 sec to all digits.   Neruologic: Grossly diminished via light touch to the distal toes of the right foot  Musculoskeletal: Hammertoe deformity of the second toe as well as hallux valgus and hallux rigidus deformity of the first MPJ with osseous spurring about the first MPJ.  The second toe is slightly overriding the first toe when weightbearing.  Gait: Unassisted, Nonantalgic.   No images are attached to the encounter.  Radiographs:  Deferred Assessment:   1. Acquired hammertoe of right foot   2. Acquired hallux valgus of right foot   3. Hallux limitus of right foot      Plan:  Patient was evaluated and treated and all questions answered.  # Hammertoe deformity second toe right foot with overriding second toe over the great toe due  to hallux valgus and hallux rigidus of the right foot - Discussed with patient the nature of this issue including the significant digital deformity both in the great toe at the first MPJ as well as in the second toe with hammertoe deformity. - He does have a history of arthritis which is causing these acquired deformities of the foot. - I recommended a large gel toe spacer in between the 1st and 2nd toe with a boot that attaches to the great toe. - This will prevent the first toe from rubbing into the second toe and prevent him from overriding of the toes when he is weight bearing and ambulate. - Patient tried the toe spacer and says that it felt good - Continue use of the spacers we provided him with a second  - Follow-up as needed          Marolyn JULIANNA Honour, DPM [facility] / [facility]

## 2023-11-04 ENCOUNTER — Telehealth: Payer: Self-pay

## 2023-11-04 NOTE — Telephone Encounter (Signed)
 Attempt Prior Authorization for Pregabalin  50 caps created in Cover My Meds. Patient has been informed we need the durg coverage information uploaded to his Mychart.   Return message in Cover My Med was :  Your PA has been resolved, no additional PA is required. For further inquiries please contact the number on the back of the member prescription card. (Message [phone])

## 2024-02-04 ENCOUNTER — Encounter: Attending: Physical Medicine and Rehabilitation | Admitting: Physical Medicine and Rehabilitation

## 2024-02-04 NOTE — Progress Notes (Deleted)
 Subjective:    Patient ID: Tyler Rangel, male    DOB: [birth year], 63 y.o.   MRN: [id number]  HPI   Pain Inventory Average Pain {NUMBERS; 0-10:5044} Pain Right Now {NUMBERS; 0-10:5044} My pain is {PAIN DESCRIPTION:21022940}  In the last 24 hours, has pain interfered with the following? General activity {NUMBERS; 0-10:5044} Relation with others {NUMBERS; 0-10:5044} Enjoyment of life {NUMBERS; 0-10:5044} What TIME of day is your pain at its worst? {time of day:24191} Sleep (in general) {BHH GOOD/FAIR/POOR:22877}  Pain is worse with: {ACTIVITIES:21022942} Pain improves with: {PAIN IMPROVES TPUY:78977056} Relief from Meds: {NUMBERS; 0-10:5044}  No family history on file. Social History   Socioeconomic History   Marital status: Married    Spouse name: Not on file   Number of children: Not on file   Years of education: Not on file   Highest education level: Not on file  Occupational History   Not on file  Tobacco Use   Smoking status: Never   Smokeless tobacco: Never  Vaping Use   Vaping status: Never Used  Substance and Sexual Activity   Alcohol use: Never   Drug use: Never   Sexual activity: Not on file  Other Topics Concern   Not on file  Social History Narrative   Not on file   Social Drivers of Health   Financial Resource Strain: Not on file  Food Insecurity: No Food Insecurity (02/13/2023)   Hunger Vital Sign    Worried About Running Out of Food in the Last Year: Never true    Ran Out of Food in the Last Year: Never true  Transportation Needs: No Transportation Needs (02/13/2023)   PRAPARE - Administrator, Civil Service (Medical): No    Lack of Transportation (Non-Medical): No  Physical Activity: Not on file  Stress: Not on file  Social Connections: Not on file   Past Surgical History:  Procedure Laterality Date   AMPUTATION Left 02/18/2023   Procedure: Repeat irrigation and debridement, resection of 5th metatarsal, antibiotic beads;   Surgeon: Malvin Marsa FALCON, DPM;  Location: [facility] OR;  Service: Orthopedics/Podiatry;  Laterality: Left;  Left foot resection of 5th metatarsal base, repeat washout, antibiotic beads stimulan   FOOT SURGERY Left    HARDWARE REMOVAL Left 02/15/2023   Procedure: HARDWARE REMOVAL with BONE BIOPSY.;  Surgeon: Malvin Marsa FALCON, DPM;  Location: [facility] OR;  Service: Orthopedics/Podiatry;  Laterality: Left;   IRRIGATION AND DEBRIDEMENT FOOT Left 02/21/2023   Procedure: REPEAT IRRIGATION AND DEBRIDEMENT FOOT WITH ANTIBIOTIC BEAD EXCHANGE;  Surgeon: Malvin Marsa FALCON, DPM;  Location: [facility] OR;  Service: Orthopedics/Podiatry;  Laterality: Left;   TENDON REPAIR Left 02/21/2023   Procedure: TENDON TRANSFER;  Surgeon: Malvin Marsa FALCON, DPM;  Location: [facility] OR;  Service: Orthopedics/Podiatry;  Laterality: Left;   Past Surgical History:  Procedure Laterality Date   AMPUTATION Left 02/18/2023   Procedure: Repeat irrigation and debridement, resection of 5th metatarsal, antibiotic beads;  Surgeon: Malvin Marsa FALCON, DPM;  Location: [facility] OR;  Service: Orthopedics/Podiatry;  Laterality: Left;  Left foot resection of 5th metatarsal base, repeat washout, antibiotic beads stimulan   FOOT SURGERY Left    HARDWARE REMOVAL Left 02/15/2023   Procedure: HARDWARE REMOVAL with BONE BIOPSY.;  Surgeon: Malvin Marsa FALCON, DPM;  Location: [facility] OR;  Service: Orthopedics/Podiatry;  Laterality: Left;   IRRIGATION AND DEBRIDEMENT FOOT Left 02/21/2023   Procedure: REPEAT IRRIGATION AND DEBRIDEMENT FOOT WITH ANTIBIOTIC BEAD EXCHANGE;  Surgeon: Malvin Marsa FALCON, DPM;  Location: [facility] OR;  Service:  Orthopedics/Podiatry;  Laterality: Left;   TENDON REPAIR Left 02/21/2023   Procedure: TENDON TRANSFER;  Surgeon: Malvin Marsa FALCON, DPM;  Location: [facility] OR;  Service: Orthopedics/Podiatry;  Laterality: Left;   Past Medical History:  Diagnosis Date   Diabetes mellitus without complication (HCC)    GERD  (gastroesophageal reflux disease)    There were no vitals taken for this visit.  Opioid Risk Score:   Fall Risk Score:  `1  Depression screen [facility] 2/9     08/04/2023    3:35 PM 05/12/2023    1:33 PM 03/28/2023   11:24 AM 02/10/2023    9:09 AM  Depression screen PHQ 2/9  Decreased Interest 0 0 0 0  Down, Depressed, Hopeless 0 0 0 0  PHQ - 2 Score 0 0 0 0  Altered sleeping    0  Tired, decreased energy    0  Change in appetite    0  Feeling bad or failure about yourself     0  Trouble concentrating    0  Moving slowly or fidgety/restless    0  Suicidal thoughts    0  PHQ-9 Score    0    Review of Systems     Objective:   Physical Exam        Assessment & Plan:

## 2024-03-09 NOTE — Progress Notes (Unsigned)
 Subjective:    Patient ID: Tyler Rangel, male    DOB: [birth year], 63 y.o.   MRN: [id number]  HPI   Pain Inventory Average Pain {NUMBERS; 0-10:5044} Pain Right Now {NUMBERS; 0-10:5044} My pain is {PAIN DESCRIPTION:21022940}  In the last 24 hours, has pain interfered with the following? General activity {NUMBERS; 0-10:5044} Relation with others {NUMBERS; 0-10:5044} Enjoyment of life {NUMBERS; 0-10:5044} What TIME of day is your pain at its worst? {time of day:24191} Sleep (in general) {BHH GOOD/FAIR/POOR:22877}  Pain is worse with: {ACTIVITIES:21022942} Pain improves with: {PAIN IMPROVES TPUY:78977056} Relief from Meds: {NUMBERS; 0-10:5044}  No family history on file. Social History   Socioeconomic History   Marital status: Married    Spouse name: Not on file   Number of children: Not on file   Years of education: Not on file   Highest education level: Not on file  Occupational History   Not on file  Tobacco Use   Smoking status: Never   Smokeless tobacco: Never  Vaping Use   Vaping status: Never Used  Substance and Sexual Activity   Alcohol use: Never   Drug use: Never   Sexual activity: Not on file  Other Topics Concern   Not on file  Social History Narrative   Not on file   Social Drivers of Health   Financial Resource Strain: Not on file  Food Insecurity: No Food Insecurity (02/13/2023)   Hunger Vital Sign    Worried About Running Out of Food in the Last Year: Never true    Ran Out of Food in the Last Year: Never true  Transportation Needs: No Transportation Needs (02/13/2023)   PRAPARE - Administrator, Civil Service (Medical): No    Lack of Transportation (Non-Medical): No  Physical Activity: Not on file  Stress: Not on file  Social Connections: Not on file   Past Surgical History:  Procedure Laterality Date   AMPUTATION Left 02/18/2023   Procedure: Repeat irrigation and debridement, resection of 5th metatarsal, antibiotic beads;   Surgeon: Malvin Marsa FALCON, DPM;  Location: [facility] OR;  Service: Orthopedics/Podiatry;  Laterality: Left;  Left foot resection of 5th metatarsal base, repeat washout, antibiotic beads stimulan   FOOT SURGERY Left    HARDWARE REMOVAL Left 02/15/2023   Procedure: HARDWARE REMOVAL with BONE BIOPSY.;  Surgeon: Malvin Marsa FALCON, DPM;  Location: [facility] OR;  Service: Orthopedics/Podiatry;  Laterality: Left;   IRRIGATION AND DEBRIDEMENT FOOT Left 02/21/2023   Procedure: REPEAT IRRIGATION AND DEBRIDEMENT FOOT WITH ANTIBIOTIC BEAD EXCHANGE;  Surgeon: Malvin Marsa FALCON, DPM;  Location: [facility] OR;  Service: Orthopedics/Podiatry;  Laterality: Left;   TENDON REPAIR Left 02/21/2023   Procedure: TENDON TRANSFER;  Surgeon: Malvin Marsa FALCON, DPM;  Location: [facility] OR;  Service: Orthopedics/Podiatry;  Laterality: Left;   Past Surgical History:  Procedure Laterality Date   AMPUTATION Left 02/18/2023   Procedure: Repeat irrigation and debridement, resection of 5th metatarsal, antibiotic beads;  Surgeon: Malvin Marsa FALCON, DPM;  Location: [facility] OR;  Service: Orthopedics/Podiatry;  Laterality: Left;  Left foot resection of 5th metatarsal base, repeat washout, antibiotic beads stimulan   FOOT SURGERY Left    HARDWARE REMOVAL Left 02/15/2023   Procedure: HARDWARE REMOVAL with BONE BIOPSY.;  Surgeon: Malvin Marsa FALCON, DPM;  Location: [facility] OR;  Service: Orthopedics/Podiatry;  Laterality: Left;   IRRIGATION AND DEBRIDEMENT FOOT Left 02/21/2023   Procedure: REPEAT IRRIGATION AND DEBRIDEMENT FOOT WITH ANTIBIOTIC BEAD EXCHANGE;  Surgeon: Malvin Marsa FALCON, DPM;  Location: [facility] OR;  Service:  Orthopedics/Podiatry;  Laterality: Left;   TENDON REPAIR Left 02/21/2023   Procedure: TENDON TRANSFER;  Surgeon: Malvin Marsa FALCON, DPM;  Location: [facility] OR;  Service: Orthopedics/Podiatry;  Laterality: Left;   Past Medical History:  Diagnosis Date   Diabetes mellitus without complication (HCC)    GERD  (gastroesophageal reflux disease)    There were no vitals taken for this visit.  Opioid Risk Score:   Fall Risk Score:  `1  Depression screen [facility] 2/9     08/04/2023    3:35 PM 05/12/2023    1:33 PM 03/28/2023   11:24 AM 02/10/2023    9:09 AM  Depression screen PHQ 2/9  Decreased Interest 0 0 0 0  Down, Depressed, Hopeless 0 0 0 0  PHQ - 2 Score 0 0 0 0  Altered sleeping    0  Tired, decreased energy    0  Change in appetite    0  Feeling bad or failure about yourself     0  Trouble concentrating    0  Moving slowly or fidgety/restless    0  Suicidal thoughts    0  PHQ-9 Score    0      Data saved with a previous flowsheet row definition    Review of Systems     Objective:   Physical Exam        Assessment & Plan:

## 2024-03-10 ENCOUNTER — Encounter: Attending: Physical Medicine and Rehabilitation | Admitting: Physical Medicine and Rehabilitation

## 2024-03-10 ENCOUNTER — Encounter: Payer: Self-pay | Admitting: Physical Medicine and Rehabilitation

## 2024-03-10 VITALS — BP 124/76 | HR 54 | Ht 66.0 in

## 2024-03-10 DIAGNOSIS — G894 Chronic pain syndrome: Secondary | ICD-10-CM | POA: Insufficient documentation

## 2024-03-10 DIAGNOSIS — Z029 Encounter for administrative examinations, unspecified: Secondary | ICD-10-CM | POA: Insufficient documentation

## 2024-03-10 DIAGNOSIS — Z79891 Long term (current) use of opiate analgesic: Secondary | ICD-10-CM | POA: Diagnosis not present

## 2024-03-10 DIAGNOSIS — Z5181 Encounter for therapeutic drug level monitoring: Secondary | ICD-10-CM | POA: Diagnosis present

## 2024-03-10 DIAGNOSIS — G8929 Other chronic pain: Secondary | ICD-10-CM | POA: Insufficient documentation

## 2024-03-10 DIAGNOSIS — M79672 Pain in left foot: Secondary | ICD-10-CM | POA: Diagnosis present

## 2024-03-10 DIAGNOSIS — M792 Neuralgia and neuritis, unspecified: Secondary | ICD-10-CM | POA: Insufficient documentation

## 2024-03-10 MED ORDER — PREGABALIN 50 MG PO CAPS: ORAL_CAPSULE | ORAL | 0 refills | Status: AC

## 2024-03-10 NOTE — Patient Instructions (Signed)
 Wean off lyrica  as instructed; let me know if you have issues with this. Dr. Pat may manage it if needed DC meloxicam  and tramadol  Follow up as needed

## 2024-03-14 LAB — DRUG TOX MONITOR 1 W/CONF, ORAL FLD

## 2024-03-14 LAB — DRUG TOX ALC METAB W/CON, ORAL FLD: Alcohol Metabolite: NEGATIVE ng/mL (ref <25)
